# Patient Record
Sex: Female | Born: 1955 | Race: White | Hispanic: No | Marital: Single | Smoking: Former smoker
Health system: Southern US, Community
[De-identification: ages and names within clinical notes are randomized; demographics above are authoritative.]

## PROBLEM LIST (undated history)

## (undated) DIAGNOSIS — A64 Unspecified sexually transmitted disease: Secondary | ICD-10-CM

## (undated) DIAGNOSIS — D649 Anemia, unspecified: Secondary | ICD-10-CM

## (undated) DIAGNOSIS — B009 Herpesviral infection, unspecified: Secondary | ICD-10-CM

## (undated) DIAGNOSIS — D72819 Decreased white blood cell count, unspecified: Secondary | ICD-10-CM

## (undated) DIAGNOSIS — E079 Disorder of thyroid, unspecified: Secondary | ICD-10-CM

## (undated) HISTORY — PX: TOTAL HIP ARTHROPLASTY: SHX124

## (undated) HISTORY — DX: Herpesviral infection, unspecified: B00.9

## (undated) HISTORY — PX: BREAST IMPLANT REMOVAL: SUR1101

## (undated) HISTORY — DX: Anemia, unspecified: D64.9

## (undated) HISTORY — PX: COLONOSCOPY: SHX174

## (undated) HISTORY — DX: Decreased white blood cell count, unspecified: D72.819

## (undated) HISTORY — DX: Disorder of thyroid, unspecified: E07.9

## (undated) HISTORY — DX: Unspecified sexually transmitted disease: A64

## (undated) HISTORY — PX: ANUS SURGERY: SHX302

---

## 2004-11-05 ENCOUNTER — Ambulatory Visit: Payer: Self-pay | Admitting: Psychiatry

## 2005-11-07 ENCOUNTER — Ambulatory Visit: Payer: Self-pay | Admitting: Obstetrics and Gynecology

## 2008-08-12 ENCOUNTER — Ambulatory Visit: Payer: Self-pay | Admitting: Obstetrics and Gynecology

## 2008-11-13 ENCOUNTER — Ambulatory Visit: Payer: Self-pay | Admitting: Unknown Physician Specialty

## 2010-11-04 ENCOUNTER — Ambulatory Visit: Payer: Self-pay | Admitting: Obstetrics and Gynecology

## 2011-09-16 ENCOUNTER — Encounter: Payer: Self-pay | Admitting: Orthopedic Surgery

## 2011-10-01 ENCOUNTER — Encounter: Payer: Self-pay | Admitting: Orthopedic Surgery

## 2011-11-01 ENCOUNTER — Encounter: Payer: Self-pay | Admitting: Orthopedic Surgery

## 2012-01-25 ENCOUNTER — Ambulatory Visit: Payer: Self-pay | Admitting: Obstetrics and Gynecology

## 2013-05-23 ENCOUNTER — Ambulatory Visit: Payer: Self-pay | Admitting: Obstetrics and Gynecology

## 2013-05-30 ENCOUNTER — Ambulatory Visit: Payer: Self-pay | Admitting: Hematology and Oncology

## 2013-05-31 ENCOUNTER — Ambulatory Visit: Payer: Self-pay | Admitting: Hematology and Oncology

## 2013-06-06 ENCOUNTER — Ambulatory Visit: Payer: Self-pay | Admitting: Hematology and Oncology

## 2013-06-06 LAB — CBC CANCER CENTER
Basophil %: 0.9 %
Eosinophil #: 0.1 x10 3/mm (ref 0.0–0.7)
Eosinophil %: 2.3 %
HCT: 40 % (ref 35.0–47.0)
HGB: 13.9 g/dL (ref 12.0–16.0)
Lymphocyte %: 28.9 %
MCH: 31.4 pg (ref 26.0–34.0)
MCHC: 34.7 g/dL (ref 32.0–36.0)
MCV: 90 fL (ref 80–100)
Monocyte #: 0.4 x10 3/mm (ref 0.2–0.9)
Neutrophil #: 2.3 x10 3/mm (ref 1.4–6.5)
Neutrophil %: 58.5 %
RBC: 4.43 10*6/uL (ref 3.80–5.20)

## 2013-06-06 LAB — COMPREHENSIVE METABOLIC PANEL
Albumin: 3.8 g/dL (ref 3.4–5.0)
Alkaline Phosphatase: 79 U/L (ref 50–136)
Anion Gap: 8 (ref 7–16)
BUN: 22 mg/dL — ABNORMAL HIGH (ref 7–18)
Bilirubin,Total: 0.3 mg/dL (ref 0.2–1.0)
Chloride: 105 mmol/L (ref 98–107)
Co2: 28 mmol/L (ref 21–32)
Creatinine: 0.72 mg/dL (ref 0.60–1.30)
EGFR (African American): >60
EGFR (Non-African Amer.): >60
Glucose: 97 mg/dL (ref 65–99)
Osmolality: 285 (ref 275–301)
Potassium: 4.3 mmol/L (ref 3.5–5.1)
SGOT(AST): 22 U/L (ref 15–37)
Sodium: 141 mmol/L (ref 136–145)

## 2013-06-06 LAB — RAPID HIV-1/2 QL/CONFIRM: HIV-1/2,Rapid Ql: NEGATIVE

## 2013-07-01 ENCOUNTER — Ambulatory Visit: Payer: Self-pay | Admitting: Hematology and Oncology

## 2013-08-22 ENCOUNTER — Encounter: Payer: Self-pay | Admitting: Podiatrist

## 2013-08-22 ENCOUNTER — Ambulatory Visit (INDEPENDENT_AMBULATORY_CARE_PROVIDER_SITE_OTHER): Payer: BC Managed Care – PPO | Admitting: Podiatrist

## 2013-08-22 VITALS — BP 93/64 | HR 78 | Resp 16 | Ht 67.0 in | Wt 187.0 lb

## 2013-08-22 DIAGNOSIS — B351 Tinea unguium: Secondary | ICD-10-CM

## 2013-08-22 DIAGNOSIS — L03039 Cellulitis of unspecified toe: Secondary | ICD-10-CM

## 2013-08-22 MED ORDER — EFINACONAZOLE 10 % EX SOLN: 1.0000 [drp] | Freq: Every day | CUTANEOUS | Status: DC

## 2013-08-22 NOTE — Progress Notes (Signed)
Subjective:  Patient presents complainng of an infected Left great toenail.  Relates it is red ,swollen , has some pus.  Its been occurring for 8 weeks and has gradually gotten worse.  She also wants her 2nd and 5th toenails checked on the left foot.  Objective:  GENERAL APPEARANCE: Alert, conversant. Appropriately groomed. No acute distress.  VASCULAR: Pedal pulses palpable and strong bilateral.  Capillary refill time is immediate to all digits,  Proximal to distal cooling it warm to warm.  Digital hair growth is present bilateral  NEUROLOGIC: sensation is intact epicritically and protectively to 5.07 monofilament at 5/5 sites bilateral.  Light touch is intact bilateral, vibratory sensation intact bilateral, achilles tendon reflex is intact bilateral.  MUSCULOSKELETAL: acceptable muscle strength, tone and stability bilateral.  Intrinsic muscluature intact bilateral.  Rectus appearance of foot and digits noted bilateral.   DERMATOLOGIC: skin color, texture, and turger are within normal limits.  No preulcerative lesions are seen, no interdigital maceration noted.  No open lesions present.  Digital nails reveal yellow-brown discoloration with mycotic appearance 2nd Left.  5th appears dystrophic.  Left hallux has redness and swelling at the base of the nail fold- consistent with paronychia.  Assessment:  Paronychia left hallux nail.  Plan: Treatment options and alternatives discussed.  Recommended an incision and drainage and patient agreed.  Left hallux was prepped with alcohol and a 1 to 1 mix of 0.5% marcaine plain and 2% lidocaine plain was administered in a digital block fashion.  The toe was then prepped with betadine solution and exsanguinated.  The offending nail  was then excised and all necrotic tissue was resected.  The area was then cleansed well and antibiotic ointment and a dry sterile dressing was applied.  The patient was dispensed instructions for aftercare.   Recommended Jublia for the  mycotic 2nd toenail.

## 2013-08-22 NOTE — Patient Instructions (Signed)

## 2013-09-12 ENCOUNTER — Ambulatory Visit: Payer: Self-pay | Admitting: Hematology and Oncology

## 2013-09-12 LAB — CBC CANCER CENTER
Basophil %: 0.9 %
HCT: 41.2 % (ref 35.0–47.0)
HGB: 13.7 g/dL (ref 12.0–16.0)
Lymphocyte #: 1.1 x10 3/mm (ref 1.0–3.6)
Lymphocyte %: 33.8 %
MCH: 30.3 pg (ref 26.0–34.0)
MCV: 91 fL (ref 80–100)
Monocyte %: 8.9 %
Neutrophil %: 54.2 %
Platelet: 209 x10 3/mm (ref 150–440)
RDW: 12.8 % (ref 11.5–14.5)
WBC: 3.1 x10 3/mm — ABNORMAL LOW (ref 3.6–11.0)

## 2013-09-12 LAB — IRON AND TIBC
Iron Bind.Cap.(Total): 305 ug/dL (ref 250–450)
Iron Saturation: 31 %
Iron: 96 ug/dL (ref 50–170)
Unbound Iron-Bind.Cap.: 209 ug/dL

## 2013-09-30 ENCOUNTER — Ambulatory Visit: Payer: Self-pay | Admitting: Hematology and Oncology

## 2013-12-31 ENCOUNTER — Ambulatory Visit: Payer: Self-pay | Admitting: Hematology and Oncology

## 2014-01-29 ENCOUNTER — Ambulatory Visit: Payer: Self-pay | Admitting: Hematology and Oncology

## 2014-03-12 ENCOUNTER — Ambulatory Visit: Payer: Self-pay | Admitting: Hematology and Oncology

## 2014-03-31 ENCOUNTER — Ambulatory Visit: Payer: Self-pay | Admitting: Hematology and Oncology

## 2014-08-06 ENCOUNTER — Ambulatory Visit: Payer: Self-pay | Admitting: Obstetrics and Gynecology

## 2014-09-17 ENCOUNTER — Ambulatory Visit: Payer: Self-pay | Admitting: Hematology and Oncology

## 2014-09-30 ENCOUNTER — Ambulatory Visit: Payer: Self-pay | Admitting: Hematology and Oncology

## 2014-10-03 ENCOUNTER — Ambulatory Visit: Payer: Self-pay | Admitting: Surgery

## 2015-02-16 LAB — CREATINE: Creatine, Serum: 0.72

## 2015-03-16 ENCOUNTER — Inpatient Hospital Stay: Payer: BLUE CROSS/BLUE SHIELD | Attending: Hematology and Oncology | Admitting: Hematology and Oncology

## 2015-03-16 ENCOUNTER — Encounter (INDEPENDENT_AMBULATORY_CARE_PROVIDER_SITE_OTHER): Payer: Self-pay

## 2015-03-16 ENCOUNTER — Encounter: Payer: Self-pay | Admitting: Hematology and Oncology

## 2015-03-16 VITALS — BP 107/74 | HR 65 | Temp 96.0°F | Ht 67.5 in | Wt 195.8 lb

## 2015-03-16 DIAGNOSIS — D509 Iron deficiency anemia, unspecified: Secondary | ICD-10-CM | POA: Diagnosis not present

## 2015-03-16 DIAGNOSIS — D72819 Decreased white blood cell count, unspecified: Secondary | ICD-10-CM | POA: Diagnosis not present

## 2015-03-16 DIAGNOSIS — E079 Disorder of thyroid, unspecified: Secondary | ICD-10-CM | POA: Diagnosis not present

## 2015-03-16 DIAGNOSIS — Z79899 Other long term (current) drug therapy: Secondary | ICD-10-CM | POA: Diagnosis not present

## 2015-03-16 DIAGNOSIS — E538 Deficiency of other specified B group vitamins: Secondary | ICD-10-CM | POA: Insufficient documentation

## 2015-03-16 DIAGNOSIS — N63 Unspecified lump in breast: Secondary | ICD-10-CM | POA: Diagnosis not present

## 2015-03-16 NOTE — Progress Notes (Signed)
Pt here today for follow up regarding leukopenia; offers no complaints today

## 2015-04-22 ENCOUNTER — Telehealth: Payer: Self-pay | Admitting: *Deleted

## 2015-04-22 NOTE — Telephone Encounter (Signed)
  Did those get faxed to the cancer center?  Gem, can you check on this?  Thanks,  M

## 2015-04-22 NOTE — Telephone Encounter (Signed)
They are not in Dr [employer] mailbox here in [place]

## 2015-04-23 NOTE — Telephone Encounter (Signed)
I called and left a message with Mariea Stable at [phone] at the [facility] [place]. I requested pt's lab results and left the fax number for Pod 2 and our CC main number, should she have any questions.Marland KitchenMarland Kitchen

## 2015-04-24 NOTE — Telephone Encounter (Signed)
Pt notified of result being nml

## 2015-05-20 ENCOUNTER — Other Ambulatory Visit: Payer: Self-pay | Admitting: Obstetrics and Gynecology

## 2015-05-20 DIAGNOSIS — N63 Unspecified lump in unspecified breast: Secondary | ICD-10-CM

## 2015-08-14 ENCOUNTER — Ambulatory Visit: Payer: BLUE CROSS/BLUE SHIELD

## 2015-08-14 ENCOUNTER — Other Ambulatory Visit: Payer: BLUE CROSS/BLUE SHIELD

## 2015-09-09 ENCOUNTER — Other Ambulatory Visit: Payer: BLUE CROSS/BLUE SHIELD

## 2015-09-09 ENCOUNTER — Ambulatory Visit: Payer: BLUE CROSS/BLUE SHIELD

## 2015-09-17 ENCOUNTER — Ambulatory Visit: Payer: BLUE CROSS/BLUE SHIELD | Admitting: Hematology and Oncology

## 2015-09-28 ENCOUNTER — Inpatient Hospital Stay: Payer: BLUE CROSS/BLUE SHIELD | Attending: Hematology and Oncology | Admitting: Hematology and Oncology

## 2015-09-28 VITALS — BP 106/72 | HR 62 | Temp 96.3°F | Resp 18 | Ht 67.5 in | Wt 195.8 lb

## 2015-09-28 DIAGNOSIS — N63 Unspecified lump in breast: Secondary | ICD-10-CM | POA: Diagnosis not present

## 2015-09-28 DIAGNOSIS — E538 Deficiency of other specified B group vitamins: Secondary | ICD-10-CM | POA: Diagnosis not present

## 2015-09-28 DIAGNOSIS — D509 Iron deficiency anemia, unspecified: Secondary | ICD-10-CM | POA: Diagnosis not present

## 2015-09-28 DIAGNOSIS — Z79899 Other long term (current) drug therapy: Secondary | ICD-10-CM | POA: Diagnosis not present

## 2015-09-28 DIAGNOSIS — D72819 Decreased white blood cell count, unspecified: Secondary | ICD-10-CM | POA: Insufficient documentation

## 2015-09-28 DIAGNOSIS — E079 Disorder of thyroid, unspecified: Secondary | ICD-10-CM | POA: Diagnosis not present

## 2015-09-28 DIAGNOSIS — Z96642 Presence of left artificial hip joint: Secondary | ICD-10-CM | POA: Insufficient documentation

## 2015-09-28 NOTE — Progress Notes (Signed)
[facility]-  Cancer Center  Clinic day:  03/16/2015  Chief Complaint: Carol Davis is a 59 y.o. female with leukopenia who is seen for reassessment.  HPI: The patient states that she went to give blood.  She was declined secondary to anemia.  She described being really tired for 1 year.  She was diagnosed with severe iron deficiency anemia and B12 deficiency.  She denies any melena or hematochezia.  She states that her diet is healthy.  She eats meat, fruits and vegetables.  She denies any pica.  She was initially referred for leukopenia noted on a routine physical in 2014.  WBC was 3,300 on 05/29/2013.  Hemoglobin and platelet were normal.  ANC has ranged between 1900 and 2300 over the past 3 years.  She has been on B12 since 09/16/2013.  She describes being on shots for a year then oral B12 for 6 months.  She denies any problems with infections or gingivitis.    Notes indicate a history of ulcerative colitis.  She underwent colonoscopy in 2015 by Dr. Mechele Collin.  There was no signs of ulcerative colitis.  She is due for a second colonoscopy in 7 years.  She denies any prior EGD.  She denies any blood in her stool.  She was last seen in the medical oncology clinic on 09/17/2014.  At that time, she was noted to have a 3-4 cm mobile right breast mass at the 7 o'clock position.  She was referred to a breast surgeon.  She notes a breast lump was found, a "fatty tumor".  Low WBC and RBC.  Labs on 02/17/2015 from [facility] reveal a hematocrit of 39.8, hemoglobin 13.4, MCV 91, WBC 3500 with an ANC of 1700.  Differential included 49% neutrophils, 37% lymphs, 9% monocytes, 4% eosinophils, and 1% basophils.  Comprehensive metabolic panel was normal including a creatinine of 0.61.  TSH, T4, T3 uptake, and free thyroxine index were normal.    Symptomatically, she denies any B symptoms.  She states that she needs a left hip replacement.  Her knees bother her a lot secondary to her weight.   Exercising helps.  Past Medical History  Diagnosis Date  . Thyroid disease   . Leucopenia   . Anemia     IDA    No past surgical history on file.  Family History  Problem Relation Age of Onset  . Diabetes Mother   . Diabetes Father   . Diabetes Brother   She has a brother with ulcerative colitis.  Her mother has irritable bowel.  Social History:  reports that she has never smoked. She has never used smokeless tobacco. She reports that she does not drink alcohol or use illicit drugs.  The patient is alone today.  Allergies: No Known Allergies  Current Medications: Current Outpatient Prescriptions  Medication Sig Dispense Refill  . levothyroxine (SYNTHROID, LEVOTHROID) 150 MCG tablet Take 150 mcg by mouth daily before breakfast.    . valACYclovir (VALTREX) 500 MG tablet TAKE ONE TABLET TWICE A DAY WITH FOOD    . vitamin B-12 (CYANOCOBALAMIN) 1000 MCG tablet Take 1,000 mcg by mouth daily.    . cyanocobalamin (,VITAMIN B-12,) 1000 MCG/ML injection Inject 1,000 mcg into the muscle every 30 (thirty) days.    . Efinaconazole 10 % SOLN Apply 1 drop topically daily. (Patient not taking: Reported on 03/16/2015) 1 Bottle 4   No current facility-administered medications for this visit.    Review of Systems:  GENERAL:  Tired.  No fevers, sweats or weight loss. PERFORMANCE STATUS (ECOG):  1 HEENT:  No visual changes, runny nose, sore throat, mouth sores or tenderness. Lungs: No shortness of breath or cough.  No hemoptysis. Cardiac:  No chest pain, palpitations, orthopnea, or PND. GI:  Bowels are "fine".  No nausea, vomiting, diarrhea, constipation, melena or hematochezia. GU:  No urgency, frequency, dysuria, or hematuria. Musculoskeletal:  Needs left hip replacement.  Knees hurt.  No back pain.  No muscle tenderness. Extremities:  No pain or swelling. Skin:  No rashes or skin changes. Neuro:  No headache, numbness or weakness, balance or coordination issues. Endocrine:  Thyroid  disease.  No diabetes, hot flashes or night sweats. Psych:  No mood changes, depression or anxiety. Pain:  No focal pain. Review of systems:  All other systems reviewed and found to be negative.  Physical Exam: Blood pressure 107/74, pulse 65, temperature 96 F (35.6 C), height 5' 7.5" (1.715 m), weight 195 lb 12.3 oz (88.8 kg). GENERAL:  Well developed, well nourished, sitting comfortably in the exam room in no acute distress. MENTAL STATUS:  Alert and oriented to person, place and time. HEAD:  Short blonde hair.  Normocephalic, atraumatic, face symmetric, no Cushingoid features. EYES:  Blues.  Pupils equal round and reactive to light and accomodation.  No conjunctivitis or scleral icterus. ENT:  Oropharynx clear without lesion.  Tongue normal. Mucous membranes moist.  RESPIRATORY:  Clear to auscultation without rales, wheezes or rhonchi. CARDIOVASCULAR:  Regular rate and rhythm without murmur, rub or gallop. ABDOMEN:  Soft, non-tender, with active bowel sounds, and no hepatosplenomegaly.  No masses. SKIN:  No rashes, ulcers or lesions. EXTREMITIES: No edema, no skin discoloration or tenderness.  No palpable cords. LYMPH NODES: No palpable cervical, supraclavicular, axillary or inguinal adenopathy  NEUROLOGICAL: Unremarkable. PSYCH:  Appropriate.  No visits with results within 3 Day(s) from this visit. Latest known visit with results is:  Abstract on 02/16/2015  Component Date Value Ref Range Status  . Creatine, Serum 06/06/2013 0.72   Final    Assessment:  Carol Davis is a 59 y.o. female with a history of leukopenia and iron deficiency anemia.  Leuukopenia was noted on a routine physical in 2014.  WBC was 3,300 on 05/29/2013.  Hemoglobin and platelet were normal.  ANC has ranged between 1900 and 2300 over the past 3 years.  She denies any problems with infections or gingivitis.  She has B12 deficiency and has been on B12 since 09/16/2013.  She was on shots for a year then oral  B12 for 6 months.   Notes indicate a history of ulcerative colitis.  She underwent colonoscopy in 2015.  There was no signs of ulcerative colitis.  She denies any melena or hematochezia.  Diet is good.  She denies any pica.  She has a history of a 3-4 cm mobile right breast mass at the 7 o'clock position.  She was referred to a breast surgeon.  She notes a breast lump was found, a "fatty tumor".   Labs on 02/17/2015 from [facility] revealed a hematocrit of 39.8, hemoglobin 13.4, MCV 91, WBC 3500 with an ANC of 1700.  Differential was unremakable.    Symptomatically, she denies any B symptoms.  She states that she needs a left hip replacement.  Her knees bother her a lot secondary to her weight.  Exercising helps.  Exam is unremarkable.  Plan: 1. Review entire medical history, diagnosis of iron deficiency anemia and leukopenia. 2. Continue oral B12.  3. Slip for labs (ferritin, ANA).  Nurse to call patient with results 4. RTC in 6 months for MD assess and review of [facility] labs (CBC with diff, feriritin).   Rosey BathMelissa C Corcoran, MD  03/16/2015

## 2015-10-09 ENCOUNTER — Telehealth: Payer: Self-pay | Admitting: *Deleted

## 2015-10-09 NOTE — Telephone Encounter (Signed)
  Do you have her lab results?  M

## 2015-10-09 NOTE — Telephone Encounter (Signed)
Calling to make sure that Dr Merlene Pullingorcoran received a copy of her lab results. Please call her if did not receive them

## 2015-10-09 NOTE — Telephone Encounter (Signed)
Called [facility] and got a copy of patient's labs and gave them to Dr. Merlene Pullingorcoran.

## 2015-12-08 ENCOUNTER — Encounter: Payer: Self-pay | Admitting: Hematology and Oncology

## 2015-12-08 NOTE — Progress Notes (Addendum)
[facility]-  Cancer Center  Clinic day:  09/28/2015  Chief Complaint: Carol Davis is a 60 y.o. female with a history of leukopenia, iron deficiency anemia, and B12 deficiency who is seen for 6 month assessment.  HPI: The patient was last seen in the hematology clinic on 03/16/2015.  At that time, she was seen for initial assessment by me. Symptomatically she denied any B symptoms.  She needed a left hip replacement. Her knees were bothering her secondary to her weight. Exercise was helping. Labs from 02/17/2015 included a hematocrit of 39.8, hemoglobin 13.4, MCV 91, WBC 3500 with an ANC of 1700.  Differential was unremakable.  She has not neutropenic.  She denied any issues with infections.  She was to continue her oral B12.  Symptomatically, she denies any new complaints.  She feels fine.  She denies any issues with infections except for a vaginal infection.  She was given a prescription.  She continues her oral B12 daily.  Regarding her history of a breast lump, she notes her mammograms 6 months ago was fine. Biopsy by Dr. Renda Rolls was benign. She apparently had a breast implant rupture. She is planning on seeing someone about a replacement.  She is considering left hip replacement in 11/2015.    Past Medical History  Diagnosis Date  . Thyroid disease   . Leucopenia   . Anemia     IDA  She was diagnosed with ulcerative colitis at age 89 at [facility].  No past surgical history on file.  Family History  Problem Relation Age of Onset  . Diabetes Mother   . Diabetes Father   . Diabetes Brother   She has a brother with ulcerative colitis.  Her mother has irritable bowel.  Social History:  reports that she has never smoked. She has never used smokeless tobacco. She reports that she does not drink alcohol or use illicit drugs.  The patient is alone today.  Allergies: No Known Allergies  Current Medications: Current Outpatient Prescriptions  Medication Sig  Dispense Refill  . Cholecalciferol (VITAMIN D-1000 MAX ST) 1000 UNITS tablet Take by mouth.    . cyanocobalamin (,VITAMIN B-12,) 1000 MCG/ML injection Inject 1,000 mcg into the muscle every 30 (thirty) days.    . Efinaconazole 10 % SOLN Apply 1 drop topically daily. 1 Bottle 4  . Glucosamine-Chondroitin 750-600 MG TABS Take by mouth.    . levothyroxine (SYNTHROID, LEVOTHROID) 150 MCG tablet Take 150 mcg by mouth daily before breakfast.    . LEVOXYL 100 MCG tablet   2  . valACYclovir (VALTREX) 500 MG tablet TAKE ONE TABLET TWICE A DAY WITH FOOD    . vitamin B-12 (CYANOCOBALAMIN) 1000 MCG tablet Take 1,000 mcg by mouth daily.     No current facility-administered medications for this visit.    Review of Systems:  GENERAL:  Feels "fine".  No fevers, sweats or weight loss. PERFORMANCE STATUS (ECOG):  1 HEENT:  No visual changes, runny nose, sore throat, mouth sores or tenderness. Lungs: No shortness of breath or cough.  No hemoptysis. Cardiac:  No chest pain, palpitations, orthopnea, or PND. Breasts:  Breast implant rupture.  Mammogram 6 months ago was "fine".  Biopsy was benign. GI:  Bowels are "fine".  No nausea, vomiting, diarrhea, constipation, melena or hematochezia. GU:  No urgency, frequency, dysuria, or hematuria. Musculoskeletal:  Needs left hip replacement.  Knees hurt.  No back pain.  No muscle tenderness. Extremities:  No pain or swelling. Skin:  No rashes or skin changes. Neuro:  No headache, numbness or weakness, balance or coordination issues. Endocrine:  Thyroid disease.  No diabetes, hot flashes or night sweats. Psych:  No mood changes, depression or anxiety. Pain:  No focal pain. Review of systems:  All other systems reviewed and found to be negative.  Physical Exam: Blood pressure 106/72, pulse 62, temperature 96.3 F (35.7 C), temperature source Tympanic, resp. rate 18, height 5' 7.5" (1.715 m), weight 195 lb 12.3 oz (88.8 kg). GENERAL:  Well developed, well  nourished, sitting comfortably in the exam room in no acute distress. MENTAL STATUS:  Alert and oriented to person, place and time. HEAD:  Short blonde hair.  Normocephalic, atraumatic, face symmetric, no Cushingoid features. EYES:  Hazel/green.  Pupils equal round and reactive to light and accomodation.  No conjunctivitis or scleral icterus. ENT:  Oropharynx clear without lesion.  Tongue normal. Mucous membranes moist.  RESPIRATORY:  Clear to auscultation without rales, wheezes or rhonchi. CARDIOVASCULAR:  Regular rate and rhythm without murmur, rub or gallop. ABDOMEN:  Soft, non-tender, with active bowel sounds, and no hepatosplenomegaly.  No masses. SKIN:  No rashes, ulcers or lesions. EXTREMITIES: No edema, no skin discoloration or tenderness.  No palpable cords. LYMPH NODES: No palpable cervical, supraclavicular, axillary or inguinal adenopathy  NEUROLOGICAL: Unremarkable. PSYCH:  Appropriate.  [facility] testing: Labs on 02/17/2015 from [facility] revealed a hematocrit of 39.8, hemoglobin 13.4, MCV 91, WBC 3500 with an ANC of 1700.  Differential was unremakable.   No visits with results within 3 Day(s) from this visit. Latest known visit with results is:  Abstract on 02/16/2015  Component Date Value Ref Range Status  . Creatine, Serum 06/06/2013 0.72   Final    Assessment:  Carol Davis is a 59 y.o. female with a history of leukopenia and iron deficiency anemia.  Leuukopenia was noted on a routine physical in 2014.  WBC was 3,300 on 05/29/2013.  Hemoglobin and platelet were normal.  ANC has ranged between 1900 and 2300 over the past 3 years.  She denies any problems with infections or gingivitis.  She has B12 deficiency and has been on B12 since 09/16/2013.  She was on shots for a year then oral B12 for 6 months.   She was diagnosed with ulcerative colitis at age 38 at [facility].  She underwent colonoscopy in 2015.  There was no signs of ulcerative colitis.  She denies any melena or  hematochezia.  Diet is good.  She denies any pica.  She has a history of a 3-4 cm mobile right breast mass at the 7 o'clock position.  Breast biopsy was benign per patient report.  Mammogram 6 months ago was "fine".  She was noted to have a breast implant rupture.   Labs on 02/17/2015 from [facility] revealed a hematocrit of 39.8, hemoglobin 13.4, MCV 91, WBC 3500 with an ANC of 1700.  Differential was unremakable.    Symptomatically, she denies any B symptoms.  She is considering a left hip replacement in 2017.  Exam is unremarkable.  Plan: 1. [facility] slip:  CBC with diff, ANA, B12, folate, ferritin, iron studies. 2. Patient to call RN for lab results. 3. Continue oral B12. 4. RTC prn.  Addendum:  Labs on 02/17/2015 from [facility] revealed a hematocrit of 38.7, hemoglobin 12.9, MCV 91, WBC 4200 with an ANC of 2700.  Differential was unremakable.  Ferritin was 95. Iron studies included a saturation of 34% with a TIBC of 228 (low).  B12  was 927. Folate was 8.9.  ANA was negative.   Rosey Bath, MD  09/28/2015

## 2016-01-04 ENCOUNTER — Ambulatory Visit: Payer: BLUE CROSS/BLUE SHIELD | Attending: Orthopedic Surgery | Admitting: Physical Therapy

## 2016-01-04 DIAGNOSIS — Z96649 Presence of unspecified artificial hip joint: Secondary | ICD-10-CM

## 2016-01-04 DIAGNOSIS — M25562 Pain in left knee: Secondary | ICD-10-CM | POA: Diagnosis present

## 2016-01-04 DIAGNOSIS — Z9660 Presence of unspecified orthopedic joint implant: Secondary | ICD-10-CM | POA: Diagnosis present

## 2016-01-04 DIAGNOSIS — R531 Weakness: Secondary | ICD-10-CM | POA: Diagnosis present

## 2016-01-04 DIAGNOSIS — M25561 Pain in right knee: Secondary | ICD-10-CM | POA: Diagnosis present

## 2016-01-04 NOTE — Patient Instructions (Signed)
All exercises provided were adapted from [email or web address]. Patient was provided a written handout with pictures as described. Any additional cues were manually entered in to handout and copied in to this document.  Prone Quadriceps Stretch  Lie on your stomach; hook a strap around your foot/ankle.  Gently pull your heel towards your bottom until a stretch is felt in the front of the thigh.  Keep your back relaxed and your hips flat on the table.  Supine bridge  Lie down on your back and bend your knees so that your feet are flat on the table. Press your heels into the ground to lift your hips off of the table. You should stabilize through your core. Return to the starting position controlling your speed.    PATELLA MEDIAL GLIDE - SELF MOBILIZATION  Place your hand along the outer edge of your knee cap and slide it inward toward your midline.

## 2016-01-05 NOTE — Therapy (Signed)
[facility] [address], [place], [address] Phone: [phone]   Fax:  [phone]  Physical Therapy Evaluation  Patient Details  Name: Carol Davis MRN: [id number] Date of Birth: January 26, 1956 No Data Recorded  Encounter Date: 01/04/2016      PT End of Session - 01/04/16 0916    Visit Number 1   Number of Visits 9   Date for PT Re-Evaluation 02/08/16   PT Start Time 0805   PT Stop Time 0905   PT Time Calculation (min) 60 min   Activity Tolerance Patient tolerated treatment well   Behavior During Therapy [facility] for tasks assessed/performed      Past Medical History  Diagnosis Date  . Thyroid disease   . Leucopenia   . Anemia     IDA    No past surgical history on file.  There were no vitals filed for this visit.  Visit Diagnosis:  Right anterior knee pain - Plan: PT plan of care cert/re-cert  Left anterior knee pain - Plan: PT plan of care cert/re-cert  S/P hip replacement - Plan: PT plan of care cert/re-cert  Generalized weakness - Plan: PT plan of care cert/re-cert      Subjective Assessment - 01/04/16 0919    Subjective Patient reports she used a rowing ergometer and performed knee curls/extensions at the gym roughly 2.5 weeks ago and began to have roughly 2 weeks of knee pain limiting her ability to move around at work and at home.   Pertinent History Patient reports she had L anterior approach THA on 11/04/2015. She has tried going to a [employer] 1x per week since the start of February. She reports knee pain (which is her chief concern) bilaterally after prolonged sitting and with ascending/descending stairs. She has had this pain for years, but has intensified since her operation.    Limitations Sitting;Standing   How long can you stand comfortably? 20-30 minutes   Patient Stated Goals To complete weight lifting classes and return to the gym.    Currently in Pain? Yes   Pain Score --  Worst pain is 5/10, she reports her R knee hurts more today but does not rate specifically.    Pain Location Knee   Pain Orientation Left;Right   Pain Descriptors / Indicators Aching   Pain Type Chronic pain   Pain Onset More than a month ago   Pain Frequency Intermittent   Aggravating Factors  Prolonged sitting then standing,             [facility] PT Assessment - 01/04/16 0810    Assessment   Medical Diagnosis --  L total hip replacement    Referring Provider --  Dr. Myer Haff    Precautions   Precautions Anterior Hip   Precaution Comments --  Sx on 11/04/2015   Restrictions   Weight Bearing Restrictions Yes   LLE Weight Bearing Weight bearing as tolerated   Balance Screen   Has the patient fallen in the past 6 months No   Prior Function   Level of Independence Independent   Vocation Full time employment   Vocation Requirements --  Librarian   Cognition   Overall Cognitive Status Within Functional Limits for tasks assessed   Observation/Other Assessments   Lower Extremity Functional Scale  35   Sensation   Light Touch Appears Intact   Step Up   Comments --  Anterior knee displacement -- painful  Step Down   Comments --  Painful on descent, requires use of UEs   Sit to Stand   Comments --  Valgus bilaterally, requires use of UEs, feet posterior knee   Strength   Right Hip Flexion 5/5   Right Hip External Rotation  4+/5   Right Hip Internal Rotation 4+/5   Right Hip ABduction 4/5   Left Hip Flexion 5/5   Left Hip External Rotation 4+/5   Left Hip Internal Rotation 4-/5   Left Hip ABduction 3+/5   Right Knee Flexion 5/5   Right Knee Extension 5/5   Left Knee Flexion 5/5   Left Knee Extension 5/5   Right Ankle Dorsiflexion 5/5   Right Ankle Plantar Flexion 5/5   Right Ankle Inversion 5/5   Right Ankle Eversion 5/5   Left Ankle Dorsiflexion 5/5   Left Ankle Plantar Flexion 5/5   Left Ankle Inversion 5/5   Left Ankle Eversion 5/5    Palpation   Patella mobility --  Pain with medial glide on RLE   Spinal mobility --  No pain with grade II throughout L spine   Criag's Test    Findings Negative   Ely's Test   Findings Positive   Side Right;Left   Comments --  R side to 90 degrees, L side further    Step-up/Step Down    Findings Positive   Side  Right;Left     TherEx Prone quadriceps stretching x 10 on RLE with blue belt x 10, x 10 on LLE. 2 rounds of PNF contract-relax on RLE x 5 repetitions (well tolerated, noted decreased pain with mobility afterwards).  Medial glide on RLE x 30" for 3 bouts with knee in extension (noted pain initially which reduced with repeated exposure).  Supine bridging x 10 repetitions with cuing for gluteal activation (no pain with this, appropriate technique and foot placement).                      PT Education - 01/04/16 [id number]    Education provided Yes   Education Details Educated patient on compilation of symptoms relating to knee pain bilaterally and HEP to address findings.    Person(s) Educated Patient   Methods Explanation;Demonstration;Handout   Comprehension Verbalized understanding;Returned demonstration             PT Long Term Goals - 01/04/16 1042    PT LONG TERM GOAL #1   Title Patient will report an LEFS score of greater than 45/80 to demonstrate improved tolerance for ADLs.   Baseline 35/80   Time 4   Period Weeks   Status New   PT LONG TERM GOAL #2   Title Patient will report worst knee pain of 2/10 during functional activities to demonstrate improved tolerance for ADLs.    Baseline 5/10   Time 6   Period Weeks   Status New   PT LONG TERM GOAL #3   Title Patient will transfer sit to stand without use of hands to demonstrate improved functional strength for ADLs.    Baseline Unable to transfer without use of UEs.    Time 4   Period Weeks   Status New   PT LONG TERM GOAL #4   Title Patient will complete weight lifting session with no  increase in pain and full participation to return to recreational activities.    Time 4   Period Weeks   Status New  Plan - 01/04/16 0916    Clinical Impression Statement Patient demonstrates generalized LE weakness, particularly with inability to complete sit to stand transfer without use of UEs. She also demonstrates retinacular and quadricep tightness on RLE which reproduce her symptoms, and inability to perform SLR on LLE secondary to weakness. It appears her knee pain is a result of underlying thigh and hip weakness and soft tissue restrictions. She would benefit from skilled PT services to address the above deficits for full recreational participation.    Pt will benefit from skilled therapeutic intervention in order to improve on the following deficits Abnormal gait;Difficulty walking;Pain;Decreased balance;Decreased strength;Decreased mobility;Decreased activity tolerance   Rehab Potential Good   Clinical Impairments Affecting Rehab Potential Age, chronicity of pain   PT Frequency 2x / week   PT Duration 4 weeks   PT Treatment/Interventions Taping;Therapeutic exercise;Therapeutic activities;Manual techniques;Balance training;Gait training;Stair training   PT Next Visit Plan Sidelying clamshells, quadricep strengthening, gait training.   PT Home Exercise Plan See patient instructions.    Consulted and Agree with Plan of Care Patient         Problem List Patient Active Problem List   Diagnosis Date Noted  . Leukopenia 03/16/2015  . Iron deficiency anemia 03/16/2015  . B12 deficiency 03/16/2015   Kerin RansomPatrick A Jazmon Kos, PT, DPT    01/05/2016, 12:35 PM  [facility] [address] [place], [place], [address] Phone: [phone]   Fax:  [phone]  Name: Louann SjogrenBetty L Colebank MRN: [id number] Date of Birth: 12/03/1955

## 2016-01-07 ENCOUNTER — Ambulatory Visit: Payer: BLUE CROSS/BLUE SHIELD | Admitting: Physical Therapy

## 2016-01-11 ENCOUNTER — Ambulatory Visit: Payer: BLUE CROSS/BLUE SHIELD | Admitting: Physical Therapy

## 2016-01-14 ENCOUNTER — Ambulatory Visit: Payer: BLUE CROSS/BLUE SHIELD | Admitting: Physical Therapy

## 2016-01-14 DIAGNOSIS — R531 Weakness: Secondary | ICD-10-CM

## 2016-01-14 DIAGNOSIS — M25561 Pain in right knee: Secondary | ICD-10-CM | POA: Diagnosis not present

## 2016-01-14 DIAGNOSIS — Z96649 Presence of unspecified artificial hip joint: Secondary | ICD-10-CM

## 2016-01-14 NOTE — Therapy (Signed)
[place] [facility] [address] [place], [place], [address] Phone: [phone]   Fax:  [phone]  Physical Therapy Treatment  Patient Details  Name: Carol SjogrenBetty L Russomanno MRN: [id number] Date of Birth: 11/07/1955 No Data Recorded  Encounter Date: 01/14/2016      PT End of Session - 01/14/16 1815    Visit Number 2   Number of Visits 9   Date for PT Re-Evaluation 02/08/16   PT Start Time 1530   PT Stop Time 1610   PT Time Calculation (min) 40 min   Activity Tolerance Patient tolerated treatment well   Behavior [facility] Therapy [facility] for tasks assessed/performed      Past Medical History  Diagnosis Date  . Thyroid disease   . Leucopenia   . Anemia     IDA    No past surgical history on file.  There were no vitals filed for this visit.  Visit Diagnosis:  Right anterior knee pain  Generalized weakness  S/P hip replacement      Subjective Assessment - 01/14/16 1538    Subjective Patient reports her walking has been better, though she reports prone quad stretching has been painful and has had trouble with medial glide of her RLE.    Pertinent History Patient reports she had L anterior approach THA on 11/04/2015. She has tried going [facility] a [employer] 1x per week since the start of February. She reports knee pain (which is her chief concern) bilaterally after prolonged sitting and with ascending/descending stairs. She has had this pain for years, but has intensified since her operation.    Limitations Sitting;Standing   How long can you stand comfortably? 20-30 minutes   Patient Stated Goals To complete weight lifting classes and return to the gym.    Currently in Pain? Yes  6-7 during ambulaiton, none at rest in her R knee      TherEx Prone quadriceps stretching with PT assistance for slight overpressure 3 bouts x 45-60" per bout Manually assisted PF stretch into DF while patient had RLE extended 3 bouts  x 30" PT assessed medial/lateral glide on RLE of patella, no pain reported in this session.  Patient reported minor decrease in R knee pain after the above stretching  Eccentric lowering/stretching of calf musculature on 1st step x 10 repetitions, after 2 bouts patient ambulated in clinic and reports notable change  Lunge stretching for knee extensors onto 2nd step 2 bouts x 10 repetitions, patient reports significantly less pain in her R knee both on flat ground and ascending/descending steps.   After reports of decreased symptoms, repeated 2 bouts of eccentric calf stretching and lunge stretching. Patient reports at the end of session noticeable reduction in symptoms. Educated patient about static compression of patella and how this can irritate local nervous system, to gradually progress stretching program and begin strengthening program.   Standing hip abductions with HHA x 10 bilaterally with appropriate activation of hip abductors.                                 PT Long Term Goals - 01/04/16 1042    PT LONG TERM GOAL #1   Title Patient will report an LEFS score of greater than 45/80 to demonstrate improved tolerance for ADLs.   Baseline 35/80   Time 4   Period Weeks   Status New   PT LONG TERM  GOAL #2   Title Patient will report worst knee pain of 2/10 during functional activities to demonstrate improved tolerance for ADLs.    Baseline 5/10   Time 6   Period Weeks   Status New   PT LONG TERM GOAL #3   Title Patient will transfer sit to stand without use of hands to demonstrate improved functional strength for ADLs.    Baseline Unable to transfer without use of UEs.    Time 4   Period Weeks   Status New   PT LONG TERM GOAL #4   Title Patient will complete weight lifting session with no increase in pain and full participation to return to recreational activities.    Time 4   Period Weeks   Status New               Plan - 01/14/16 1816     Clinical Impression Statement Patient demonstrates reduced symptoms with knee extensor stretching and eccentric loading/stretching of PFs, indicating she likely has symptoms of knee pain from excessive static compression of the knee joint. Patient does demonstrate RLE abductor weakness both at eval and in this session which would also exacerbate knee pain and should be addressed.    Pt will benefit from skilled therapeutic intervention in order to improve on the following deficits Abnormal gait;Difficulty walking;Pain;Decreased balance;Decreased strength;Decreased mobility;Decreased activity tolerance   Rehab Potential Good   Clinical Impairments Affecting Rehab Potential Age, chronicity of pain   PT Frequency 2x / week   PT Duration 4 weeks   PT Treatment/Interventions Taping;Therapeutic exercise;Therapeutic activities;Manual techniques;Balance training;Gait training;Stair training   PT Next Visit Plan Sidelying clamshells, quadricep strengthening, gait training.   PT Home Exercise Plan See patient instructions.    Consulted and Agree with Plan of Care Patient        Problem List Patient Active Problem List   Diagnosis Date Noted  . Leukopenia 03/16/2015  . Iron deficiency anemia 03/16/2015  . B12 deficiency 03/16/2015    Kerin Ransom, PT, DPT    01/14/2016, 6:18 PM  [place] [facility] SPORTS MEDICINE [address] [place], [address] Phone: [phone]   Fax:  [phone]  Name: Carol Davis MRN: [id number] Date of Birth: 04/26/1956

## 2016-01-18 ENCOUNTER — Ambulatory Visit: Payer: BLUE CROSS/BLUE SHIELD | Admitting: Physical Therapy

## 2016-01-18 DIAGNOSIS — R531 Weakness: Secondary | ICD-10-CM

## 2016-01-18 DIAGNOSIS — M25562 Pain in left knee: Secondary | ICD-10-CM

## 2016-01-18 DIAGNOSIS — Z96649 Presence of unspecified artificial hip joint: Secondary | ICD-10-CM

## 2016-01-18 DIAGNOSIS — M25561 Pain in right knee: Secondary | ICD-10-CM | POA: Diagnosis not present

## 2016-01-18 NOTE — Therapy (Signed)
[place] [facility] [address] [place], [address] Phone: [phone]   Fax:  [phone]  Physical Therapy Treatment  Patient Details  Name: Carol Davis MRN: [id number] Date of Birth: 25-Feb-1956 No Data Recorded  Encounter Date: 01/18/2016      PT End of Session - 01/18/16 0900    Visit Number 3   Number of Visits 9   Date for PT Re-Evaluation 02/08/16   PT Start Time 0815   PT Stop Time 0857   PT Time Calculation (min) 42 min   Activity Tolerance Patient tolerated treatment well  Mild increase in R knee pain   Behavior During Therapy [facility] for tasks assessed/performed      Past Medical History  Diagnosis Date  . Thyroid disease   . Leucopenia   . Anemia     IDA    No past surgical history on file.  There were no vitals filed for this visit.  Visit Diagnosis:  Right anterior knee pain  Generalized weakness  S/P hip replacement  Left anterior knee pain      Subjective Assessment - 01/18/16 0817    Subjective Patient reports her ROM has improved significantly in her knee. She reports she was able to increase her walking and completed all exercises over the weekend and increased ambulation with no additional complaints.    Pertinent History Patient reports she had L anterior approach THA on 11/04/2015. She has tried going to a [employer] 1x per week since the start of February. She reports knee pain (which is her chief concern) bilaterally after prolonged sitting and with ascending/descending stairs. She has had this pain for years, but has intensified since her operation.    Limitations Sitting;Standing   How long can you stand comfortably? 20-30 minutes   Patient Stated Goals To complete weight lifting classes and return to the gym.    Currently in Pain? Yes   Pain Score 3    Pain Location Knee   Pain Orientation Right  L knee bothers her as well   Pain Descriptors /  Indicators Aching   Pain Type Chronic pain   Pain Onset More than a month ago   Pain Frequency Intermittent      PNF contract relax with isometric quadricep contraction 3 bouts x 10 repetitions in prone with stretch past 90 degrees (increased symptoms afterwards)  Eccentric calf stretching 2 bouts of 10 repetitions for 3" holds   Lunge knee extensor stretch to 2nd step  2 bouts initially for 10 repetitions with 3" holds, additional 2 bouts after PNF contract relax performed to reduce symptoms.  Palpation into distal IT band, fibular head area -- performed mobilizations of fibular head, no change in symptoms. Adductor stretch with trigger point ischemia applied/soft tissue mobilization x 2 bouts of 45", patient noted tenderness while pressure applied, but some relief with ambulation afterwards. Progressed to side lunges for adductor stretch, cuing for wider stance x 10 for 2 sets.   Supine bridging (cuing for going on heels, attempted alternating lifts) x 10 for 2 sets (cuing for pushing through heels as she initially reports tension in her quadricep and anterior core) attempted alternating lifts, however she fatigued quickly.                             PT Education - 01/18/16 1842    Education provided Yes   Education Details  Progression of HEP and activities.   Person(s) Educated Patient   Methods Explanation;Demonstration   Comprehension Verbalized understanding;Returned demonstration             PT Long Term Goals - 01/04/16 1042    PT LONG TERM GOAL #1   Title Patient will report an LEFS score of greater than 45/80 to demonstrate improved tolerance for ADLs.   Baseline 35/80   Time 4   Period Weeks   Status New   PT LONG TERM GOAL #2   Title Patient will report worst knee pain of 2/10 during functional activities to demonstrate improved tolerance for ADLs.    Baseline 5/10   Time 6   Period Weeks   Status New   PT LONG TERM GOAL #3   Title  Patient will transfer sit to stand without use of hands to demonstrate improved functional strength for ADLs.    Baseline Unable to transfer without use of UEs.    Time 4   Period Weeks   Status New   PT LONG TERM GOAL #4   Title Patient will complete weight lifting session with no increase in pain and full participation to return to recreational activities.    Time 4   Period Weeks   Status New               Plan - 01/18/16 0901    Clinical Impression Statement Patient reports symptoms with palpation and stretching of R adductors, and stretch on quadriceps/plantarflexors in this session. She reports decreased pain number from last session, increased ROM, however she reports her symptoms aren't much different. She demonstrates tightness/decreased ROM around her knee and improving hip strength, which are all likely contributing to her symptoms.    Pt will benefit from skilled therapeutic intervention in order to improve on the following deficits Abnormal gait;Difficulty walking;Pain;Decreased balance;Decreased strength;Decreased mobility;Decreased activity tolerance   Rehab Potential Good   Clinical Impairments Affecting Rehab Potential Age, chronicity of pain   PT Frequency 2x / week   PT Duration 4 weeks   PT Treatment/Interventions Taping;Therapeutic exercise;Therapeutic activities;Manual techniques;Balance training;Gait training;Stair training   PT Next Visit Plan Quadricep strengthening/stretching, posterior chain strengthening.   PT Home Exercise Plan See patient instructions.    Consulted and Agree with Plan of Care Patient        Problem List Patient Active Problem List   Diagnosis Date Noted  . Leukopenia 03/16/2015  . Iron deficiency anemia 03/16/2015  . B12 deficiency 03/16/2015   Kerin RansomPatrick A Micha Erck, PT, DPT    01/18/2016, 6:46 PM  [place] [facility] [address] [place], [place], [address] Phone:  [phone]   Fax:  [phone]  Name: Carol Davis MRN: [id number] Date of Birth: 05/05/1956

## 2016-01-26 ENCOUNTER — Ambulatory Visit: Payer: BLUE CROSS/BLUE SHIELD | Admitting: Physical Therapy

## 2016-01-26 DIAGNOSIS — Z96649 Presence of unspecified artificial hip joint: Secondary | ICD-10-CM

## 2016-01-26 DIAGNOSIS — M25561 Pain in right knee: Secondary | ICD-10-CM

## 2016-01-26 DIAGNOSIS — R531 Weakness: Secondary | ICD-10-CM

## 2016-01-26 NOTE — Therapy (Signed)
[facility] [facility]IC[facility] [address] [place], [place], [address] Phone: [phone]   Fax:  [phone]  Physical Therapy Treatment  Patient Details  Name: Carol Davis MRN: [id number] Date of Birth: 01/08/1956 No Data Recorded  Encounter Date: 01/26/2016      PT End of Session - 01/26/16 1205    Visit Number 4   Number of Visits 9   Date for PT Re-Evaluation 02/08/16   PT Start Time 0806   PT Stop Time 0845   PT Time Calculation (min) 39 min   Activity Tolerance Patient tolerated treatment well;No increased pain   Behavior During Therapy [facility] for tasks assessed/performed      Past Medical History  Diagnosis Date  . Thyroid disease   . Leucopenia   . Anemia     IDA    No past surgical history on file.  There were no vitals filed for this visit.  Visit Diagnosis:  Right anterior knee pain  S/P hip replacement  Generalized weakness      Subjective Assessment - 01/26/16 0811    Subjective Patient reports [place] went to a [place] in [place] over the weekend, she has had pain (fairly intense) with knee flexion ROM exercises in anterolateral knee, just below the joint line. No hip pain reported.   Pertinent History Patient reports she had L anterior approach THA on 11/04/2015. She has tried going to a [employer] 1x per week since the start of February. She reports knee pain (which is her chief concern) bilaterally after prolonged sitting and with ascending/descending stairs. She has had this pain for years, but has intensified since her operation.    Limitations Sitting;Standing   How long can you stand comfortably? 20-30 minutes   Patient Stated Goals To complete weight lifting classes and return to the gym.    Currently in Pain? Yes   Pain Score 4    Pain Location Knee   Pain Orientation Right   Pain Descriptors / Indicators Aching;Tightness   Pain Type Chronic pain   Pain Onset More than a  month ago   Pain Frequency Intermittent   Aggravating Factors  Midstance while ambulating. Prolonged sitting then standing.    Pain Relieving Factors Sitting/non-weight bearing.      Standing lunge stretch x 5 repetitions with HHA from table, felt more pain in R knee when adductors stretched on medial side, able to feel adductor stretch on LLE. Manual palpation of multiple tender points in her adductors, soft tissue mobilization provided to R adductor both in sitting and supine with manual hip abduction. Patient reports her pain in her knee decreased from a 4-5 to a 1-2 after this.   Attempted standing hip abductor stretch with foot elevated, unable to complete comfortably after multiple attempts with different foot positioning.   Single leg stance on blue foam pad kicking into hip abduction x 12 bilaterally for 2 sets with HHA for balance only (challenging on dynamic ankle stability)   Standing manually assisted quadricep stretching 3 bouts x 6 repetitions with 10" holds (some discomfort afterwards, though more tolerable than in prone)                            PT Education - 01/26/16 1204    Education provided Yes   Education Details To limit the pain she experiences with quad stretching, begin hip adductor stretching in supine as demonstrated in  this session.    Person(s) Educated Patient   Methods Explanation;Demonstration;Handout   Comprehension Verbalized understanding;Returned demonstration             PT Long Term Goals - 01/04/16 1042    PT LONG TERM GOAL #1   Title Patient will report an LEFS score of greater than 45/80 to demonstrate improved tolerance for ADLs.   Baseline 35/80   Time 4   Period Weeks   Status New   PT LONG TERM GOAL #2   Title Patient will report worst knee pain of 2/10 during functional activities to demonstrate improved tolerance for ADLs.    Baseline 5/10   Time 6   Period Weeks   Status New   PT LONG TERM GOAL #3    Title Patient will transfer sit to stand without use of hands to demonstrate improved functional strength for ADLs.    Baseline Unable to transfer without use of UEs.    Time 4   Period Weeks   Status New   PT LONG TERM GOAL #4   Title Patient will complete weight lifting session with no increase in pain and full participation to return to recreational activities.    Time 4   Period Weeks   Status New               Plan - 01/26/16 1206    Clinical Impression Statement Patient continues to report R knee pain, especially with knee flexion or any attempt at quadricep stretching. She displays pain and tenderness throughout her adductors initially, which once treated reduced as did her symptoms. Appeared to be more effective when PT manually stretched in supine. No pain associated with patellar mobility at this time.    Pt will benefit from skilled therapeutic intervention in order to improve on the following deficits Abnormal gait;Difficulty walking;Pain;Decreased balance;Decreased strength;Decreased mobility;Decreased activity tolerance   Rehab Potential Good   Clinical Impairments Affecting Rehab Potential Age, chronicity of pain   PT Frequency 2x / week   PT Duration 4 weeks   PT Treatment/Interventions Taping;Therapeutic exercise;Therapeutic activities;Manual techniques;Balance training;Gait training;Stair training   PT Next Visit Plan Quadricep strengthening/stretching, posterior chain strengthening. Adductor stretching and strengthening.    PT Home Exercise Plan See patient instructions.    Consulted and Agree with Plan of Care Patient        Problem List Patient Active Problem List   Diagnosis Date Noted  . Leukopenia 03/16/2015  . Iron deficiency anemia 03/16/2015  . B12 deficiency 03/16/2015   Kerin Ransom, PT, DPT    01/26/2016, 12:38 PM  [place] [facility] PHYSICAL AND SPORTS MEDICINE [address], [place],  [address] Phone: [phone]   Fax:  [phone]  Name: Carol Davis MRN: [id number] Date of Birth: 02/20/1956

## 2016-01-28 ENCOUNTER — Ambulatory Visit: Payer: BLUE CROSS/BLUE SHIELD | Admitting: Physical Therapy

## 2016-01-28 DIAGNOSIS — M25561 Pain in right knee: Secondary | ICD-10-CM

## 2016-01-28 DIAGNOSIS — R531 Weakness: Secondary | ICD-10-CM

## 2016-01-28 DIAGNOSIS — Z96649 Presence of unspecified artificial hip joint: Secondary | ICD-10-CM

## 2016-01-28 NOTE — Therapy (Signed)
[place] [facility] [address] [place], [place], [address] Phone: [phone]   Fax:  [phone]  Physical Therapy Treatment  Patient Details  Name: Carol Davis MRN: [id number] Date of Birth: 03/27/1956 No Data Recorded  Encounter Date: 01/28/2016      PT End of Session - 01/28/16 1423    Visit Number 5   Number of Visits 9   Date for PT Re-Evaluation 02/08/16   PT Start Time 0840   PT Stop Time 0900   PT Time Calculation (min) 20 min   Activity Tolerance Patient tolerated treatment well;No increased pain   Behavior During Therapy [facility] for tasks assessed/performed      Past Medical History  Diagnosis Date  . Thyroid disease   . Leucopenia   . Anemia     IDA    No past surgical history on file.  There were no vitals filed for this visit.  Visit Diagnosis:  Right anterior knee pain  S/P hip replacement  Generalized weakness      Subjective Assessment - 01/28/16 0848    Subjective Patient arrived late today, she wrote down the wrong appointment time. She reports she has been diligent with her HEP and after her weightlifting class the other day   Pertinent History Patient reports she had L anterior approach THA on 11/04/2015. She has tried going to a [employer] 1x per week since the start of February. She reports knee pain (which is her chief concern) bilaterally after prolonged sitting and with ascending/descending stairs. She has had this pain for years, but has intensified since her operation.    Limitations Sitting;Standing   How long can you stand comfortably? 20-30 minutes   Patient Stated Goals To complete weight lifting classes and return to the gym.    Currently in Pain? Yes   Pain Score 2    Pain Onset More than a month ago      Supine hip abductor stretching, modified to using a belt at distal tibia 2 bouts x 10 repetitions.   SLR HS stretching 2 bouts x 10 repetitions    ** After stretching completed patient reported knee pain decreased from 4 to 2/10. Cuing provided for where to use belt, which she was able to perform adequately.                            PT Education - 01/28/16 1423    Education provided Yes   Education Details Hip abductor stretching, SLR stretching.    Person(s) Educated Patient   Methods Explanation;Demonstration;Handout   Comprehension Verbalized understanding;Returned demonstration             PT Long Term Goals - 01/04/16 1042    PT LONG TERM GOAL #1   Title Patient will report an LEFS score of greater than 45/80 to demonstrate improved tolerance for ADLs.   Baseline 35/80   Time 4   Period Weeks   Status New   PT LONG TERM GOAL #2   Title Patient will report worst knee pain of 2/10 during functional activities to demonstrate improved tolerance for ADLs.    Baseline 5/10   Time 6   Period Weeks   Status New   PT LONG TERM GOAL #3   Title Patient will transfer sit to stand without use of hands to demonstrate improved functional strength for ADLs.    Baseline Unable to transfer  without use of UEs.    Time 4   Period Weeks   Status New   PT LONG TERM GOAL #4   Title Patient will complete weight lifting session with no increase in pain and full participation to return to recreational activities.    Time 4   Period Weeks   Status New               Plan - 01/28/16 1424    Clinical Impression Statement Patient reports significant relief of symptoms after her weightlifting session and her stretching program. She reports decreased symptoms in this session with abductor and hamstring stretching in supine (from 4/10 to 2/10).    Pt will benefit from skilled therapeutic intervention in order to improve on the following deficits Abnormal gait;Difficulty walking;Pain;Decreased balance;Decreased strength;Decreased mobility;Decreased activity tolerance   Rehab Potential Good   Clinical  Impairments Affecting Rehab Potential Age, chronicity of pain   PT Frequency 2x / week   PT Duration 4 weeks   PT Treatment/Interventions Taping;Therapeutic exercise;Therapeutic activities;Manual techniques;Balance training;Gait training;Stair training   PT Next Visit Plan Quadricep strengthening/stretching, posterior chain strengthening. Adductor stretching and strengthening.    PT Home Exercise Plan See patient instructions.    Consulted and Agree with Plan of Care Patient        Problem List Patient Active Problem List   Diagnosis Date Noted  . Leukopenia 03/16/2015  . Iron deficiency anemia 03/16/2015  . B12 deficiency 03/16/2015   Kerin Ransom, PT, DPT    01/28/2016, 2:26 PM  [place] [facility] PHYSICAL AND SPORTS MEDICINE [address], [place], [address] Phone: [phone]   Fax:  [phone]  Name: Carol Davis MRN: [id number] Date of Birth: 30-Apr-1956

## 2016-02-01 ENCOUNTER — Ambulatory Visit: Payer: BLUE CROSS/BLUE SHIELD | Attending: Orthopedic Surgery | Admitting: Physical Therapy

## 2016-02-01 DIAGNOSIS — Z9660 Presence of unspecified orthopedic joint implant: Secondary | ICD-10-CM | POA: Insufficient documentation

## 2016-02-01 DIAGNOSIS — M25561 Pain in right knee: Secondary | ICD-10-CM

## 2016-02-01 DIAGNOSIS — M25562 Pain in left knee: Secondary | ICD-10-CM | POA: Diagnosis not present

## 2016-02-01 DIAGNOSIS — Z96649 Presence of unspecified artificial hip joint: Secondary | ICD-10-CM

## 2016-02-01 NOTE — Therapy (Signed)
[place] [facility] [address] [place], [place], [address] Phone: [phone]   Fax:  [phone]  Physical Therapy Treatment  Patient Details  Name: Carol Davis MRN: [id number] Date of Birth: 12/27/1955 No Data Recorded  Encounter Date: 02/01/2016      PT End of Session - 02/01/16 0857    Visit Number 6   Number of Visits 9   Date for PT Re-Evaluation 02/08/16   PT Start Time 0803   PT Stop Time 0845   PT Time Calculation (min) 42 min   Activity Tolerance Patient tolerated treatment well;No increased pain   Behavior During Therapy [facility] for tasks assessed/performed      Past Medical History  Diagnosis Date  . Thyroid disease   . Leucopenia   . Anemia     IDA    No past surgical history on file.  There were no vitals filed for this visit.  Visit Diagnosis:  Right anterior knee pain  S/P hip replacement  Left anterior knee pain      Subjective Assessment - 02/01/16 0807    Subjective Patient reports she has been progressively more active with return to walking, she reports she is continuing to have knee pain, though she is also concerned about fatigue with ambulation. She has been very compliant with her HEP.Marland Kitchen.    Pertinent History Patient reports she had L anterior approach THA on 11/04/2015. She has tried going to a [employer] 1x per week since the start of February. She reports knee pain (which is her chief concern) bilaterally after prolonged sitting and with ascending/descending stairs. She has had this pain for years, but has intensified since her operation.    Limitations Sitting;Standing   How long can you stand comfortably? 20-30 minutes   Patient Stated Goals To complete weight lifting classes and return to the gym.    Currently in Pain? Yes   Pain Score 3    Pain Location Knee   Pain Orientation Right   Pain Descriptors / Indicators Aching;Tightness   Pain Type Chronic pain   Pain Onset More than a month ago   Aggravating Factors  Sitting for a long time    Pain Relieving Factors Stretching, exercising.      TherEx ** Extensive education on sets, reps, intensity for strengthening programming. Educated patient on need to make her routine more challenging (removed leg extensions as these were painful) from sheet she provided from her trainer. Included squats, standing knee flexion with ankle weights, seated knee extensions, hip abductions, step outs. 9-10 for most exercises (30 for step outs)  Patellar mobility evaluated bilaterally - no tenderness noted  Standing quadricep stretch by PT on RLE x 10 for 3 sets with 5" holds per repetition (able to flex past 90 degrees today) -- reported decreased pain, though continued to have tightness in posterior knee Seated calf stretching manually by PT 3 bouts x 30" total (reported significantly less pain and tightness in RLE Educated patient on seated calf stretching with towel 2 bouts x 30" bilaterally (reduced discomfort) -- reported she felt much better afterwards   Leg Press 15# (too easy) progressed to 25# (too easy) total of 10 reps. Progressed to 35# (cuing for foot placement, hip/knee angle) x 10 (moderately challenging.                             PT Education - 02/01/16  5621    Education provided Yes   Education Details Extensive education on HIIT, strength training, and stretching program.    Person(s) Educated Patient   Methods Explanation;Demonstration;Handout   Comprehension Verbalized understanding;Returned demonstration             PT Long Term Goals - 01/04/16 1042    PT LONG TERM GOAL #1   Title Patient will report an LEFS score of greater than 45/80 to demonstrate improved tolerance for ADLs.   Baseline 35/80   Time 4   Period Weeks   Status New   PT LONG TERM GOAL #2   Title Patient will report worst knee pain of 2/10 during functional activities to demonstrate  improved tolerance for ADLs.    Baseline 5/10   Time 6   Period Weeks   Status New   PT LONG TERM GOAL #3   Title Patient will transfer sit to stand without use of hands to demonstrate improved functional strength for ADLs.    Baseline Unable to transfer without use of UEs.    Time 4   Period Weeks   Status New   PT LONG TERM GOAL #4   Title Patient will complete weight lifting session with no increase in pain and full participation to return to recreational activities.    Time 4   Period Weeks   Status New               Plan - 02/01/16 0857    Clinical Impression Statement Patient reports pain/tightness in her posterior knee/calf, which was relieved with manual stretching of her calf (gastroc) musculature. She was able to complete leg press exercise with minimal exercise, and counseled on progressing strengthening programming after she brought in her sheet. Educated to stop performing seated leg extensions (as these were painful).    Pt will benefit from skilled therapeutic intervention in order to improve on the following deficits Abnormal gait;Difficulty walking;Pain;Decreased balance;Decreased strength;Decreased mobility;Decreased activity tolerance   Rehab Potential Good   Clinical Impairments Affecting Rehab Potential Age, chronicity of pain   PT Frequency 2x / week   PT Duration 4 weeks   PT Treatment/Interventions Taping;Therapeutic exercise;Therapeutic activities;Manual techniques;Balance training;Gait training;Stair training   PT Next Visit Plan Quadricep strengthening/stretching, posterior chain strengthening. Adductor stretching and strengthening.    PT Home Exercise Plan See patient instructions.    Consulted and Agree with Plan of Care Patient        Problem List Patient Active Problem List   Diagnosis Date Noted  . Leukopenia 03/16/2015  . Iron deficiency anemia 03/16/2015  . B12 deficiency 03/16/2015   Kerin Ransom, PT, DPT    02/01/2016, 9:16  AM  [facility] PHYSICAL AND SPORTS MEDICINE [address], [place], [address] Phone: [phone]   Fax:  [phone]  Name: Carol Davis MRN: [id number] Date of Birth: Nov 05, 1955

## 2016-02-03 DIAGNOSIS — H0270 Unspecified degenerative disorders of eyelid and periocular area: Secondary | ICD-10-CM | POA: Diagnosis not present

## 2016-02-04 ENCOUNTER — Ambulatory Visit: Payer: BLUE CROSS/BLUE SHIELD | Admitting: Physical Therapy

## 2016-04-12 DIAGNOSIS — H5340 Unspecified visual field defects: Secondary | ICD-10-CM | POA: Diagnosis not present

## 2016-04-12 DIAGNOSIS — H02834 Dermatochalasis of left upper eyelid: Secondary | ICD-10-CM | POA: Diagnosis not present

## 2016-04-12 DIAGNOSIS — H02831 Dermatochalasis of right upper eyelid: Secondary | ICD-10-CM | POA: Diagnosis not present

## 2016-04-12 DIAGNOSIS — H02403 Unspecified ptosis of bilateral eyelids: Secondary | ICD-10-CM | POA: Diagnosis not present

## 2016-04-13 DIAGNOSIS — H02834 Dermatochalasis of left upper eyelid: Secondary | ICD-10-CM | POA: Diagnosis not present

## 2016-06-09 DIAGNOSIS — E669 Obesity, unspecified: Secondary | ICD-10-CM | POA: Diagnosis not present

## 2016-06-09 DIAGNOSIS — E063 Autoimmune thyroiditis: Secondary | ICD-10-CM | POA: Diagnosis not present

## 2016-06-09 DIAGNOSIS — E038 Other specified hypothyroidism: Secondary | ICD-10-CM | POA: Diagnosis not present

## 2016-06-09 DIAGNOSIS — R7301 Impaired fasting glucose: Secondary | ICD-10-CM | POA: Diagnosis not present

## 2016-06-21 DIAGNOSIS — Z01419 Encounter for gynecological examination (general) (routine) without abnormal findings: Secondary | ICD-10-CM | POA: Diagnosis not present

## 2016-06-23 ENCOUNTER — Other Ambulatory Visit: Payer: Self-pay | Admitting: Obstetrics and Gynecology

## 2016-06-23 DIAGNOSIS — N6310 Unspecified lump in the right breast, unspecified quadrant: Secondary | ICD-10-CM

## 2016-07-18 ENCOUNTER — Ambulatory Visit
Admission: RE | Admit: 2016-07-18 | Discharge: 2016-07-18 | Disposition: A | Discharge status: Home / Self Care | Payer: BLUE CROSS/BLUE SHIELD | Source: Ambulatory Visit | Attending: Obstetrics and Gynecology | Admitting: Obstetrics and Gynecology

## 2016-07-18 ENCOUNTER — Other Ambulatory Visit: Payer: Self-pay | Admitting: Obstetrics and Gynecology

## 2016-07-18 DIAGNOSIS — N6310 Unspecified lump in the right breast, unspecified quadrant: Secondary | ICD-10-CM

## 2016-07-18 DIAGNOSIS — N63 Unspecified lump in breast: Secondary | ICD-10-CM | POA: Diagnosis not present

## 2016-07-18 DIAGNOSIS — Z9882 Breast implant status: Secondary | ICD-10-CM | POA: Insufficient documentation

## 2016-07-18 DIAGNOSIS — R922 Inconclusive mammogram: Secondary | ICD-10-CM | POA: Diagnosis not present

## 2016-10-17 DIAGNOSIS — H02834 Dermatochalasis of left upper eyelid: Secondary | ICD-10-CM | POA: Diagnosis not present

## 2016-10-17 DIAGNOSIS — H02831 Dermatochalasis of right upper eyelid: Secondary | ICD-10-CM | POA: Diagnosis not present

## 2016-10-17 DIAGNOSIS — H5340 Unspecified visual field defects: Secondary | ICD-10-CM | POA: Diagnosis not present

## 2016-10-31 HISTORY — PX: AUGMENTATION MAMMAPLASTY: SUR837

## 2016-11-24 DIAGNOSIS — N62 Hypertrophy of breast: Secondary | ICD-10-CM | POA: Diagnosis not present

## 2017-01-02 ENCOUNTER — Ambulatory Visit: Payer: Self-pay | Admitting: Medical

## 2017-01-02 VITALS — BP 125/82 | HR 77 | Temp 97.7°F | Resp 16 | Ht 67.0 in | Wt 195.0 lb

## 2017-01-02 DIAGNOSIS — K120 Recurrent oral aphthae: Secondary | ICD-10-CM

## 2017-01-02 MED ORDER — MAGIC MOUTHWASH: 5.0000 mL | Freq: Three times a day (TID) | ORAL | 0 refills | Status: DC

## 2017-01-02 NOTE — Progress Notes (Signed)
   Subjective:    Patient ID: Carol Davis, female    DOB: 02/08/1956, 61 y.o.   MRN: [id number]  HPI patient with sore inside lower right side of lip x 3 weeks. Not healing.    Review of Systems No fevers, no discharge. Painful, not taking anything for pain.     Objective:   Physical Exam  apthous ulcer noted on right side of lower lip. Swollen with erythema.        Assessment & Plan:  Apthous ulcer  Right side of lower lip. Dukes magic mouth wash-before meals, and as needed swish and spit. Avoid acidic, salty , hot or spicy foods. Take otc advil 400mg  po q 6 hours for pain and swelling. Call if discharge or fever occurs or worsening symptoms. hand written prescription given to patient. Advil 200mg  3 packets given to patient. [id number] exp 1/20 Return to the clinic as needed.

## 2017-02-17 DIAGNOSIS — Z23 Encounter for immunization: Secondary | ICD-10-CM | POA: Diagnosis not present

## 2017-03-29 ENCOUNTER — Other Ambulatory Visit: Payer: Self-pay

## 2017-03-29 DIAGNOSIS — R7301 Impaired fasting glucose: Secondary | ICD-10-CM

## 2017-03-29 DIAGNOSIS — E063 Autoimmune thyroiditis: Secondary | ICD-10-CM

## 2017-03-30 LAB — BASIC METABOLIC PANEL
BUN/Creatinine Ratio: 21 (ref 9–23)
BUN: 13 mg/dL (ref 6–24)
CALCIUM: 8.7 mg/dL (ref 8.7–10.2)
CO2: 23 mmol/L (ref 18–29)
CREATININE: 0.63 mg/dL (ref 0.57–1.00)
Chloride: 105 mmol/L (ref 96–106)
GFR calc Af Amer: 114 mL/min/{1.73_m2} (ref >59)
GFR, EST NON AFRICAN AMERICAN: 98 mL/min/{1.73_m2} (ref >59)
Glucose: 86 mg/dL (ref 65–99)
Potassium: 4.6 mmol/L (ref 3.5–5.2)
Sodium: 143 mmol/L (ref 134–144)

## 2017-03-30 LAB — TSH: TSH: 1.47 u[IU]/mL (ref 0.450–4.500)

## 2017-03-30 LAB — HGB A1C W/O EAG: Hgb A1c MFr Bld: 5 % (ref 4.8–5.6)

## 2017-04-03 DIAGNOSIS — R7301 Impaired fasting glucose: Secondary | ICD-10-CM | POA: Diagnosis not present

## 2017-04-03 DIAGNOSIS — E038 Other specified hypothyroidism: Secondary | ICD-10-CM | POA: Diagnosis not present

## 2017-04-03 DIAGNOSIS — E669 Obesity, unspecified: Secondary | ICD-10-CM | POA: Diagnosis not present

## 2017-04-03 DIAGNOSIS — E063 Autoimmune thyroiditis: Secondary | ICD-10-CM | POA: Diagnosis not present

## 2017-05-17 ENCOUNTER — Encounter: Payer: Self-pay | Admitting: Medical

## 2017-05-17 ENCOUNTER — Ambulatory Visit: Payer: Self-pay | Admitting: Medical

## 2017-05-17 VITALS — BP 114/72 | HR 88 | Temp 98.1°F | Wt 199.2 lb

## 2017-05-17 DIAGNOSIS — H65191 Other acute nonsuppurative otitis media, right ear: Secondary | ICD-10-CM

## 2017-05-17 DIAGNOSIS — J069 Acute upper respiratory infection, unspecified: Secondary | ICD-10-CM

## 2017-05-17 DIAGNOSIS — R059 Cough, unspecified: Secondary | ICD-10-CM

## 2017-05-17 DIAGNOSIS — R05 Cough: Secondary | ICD-10-CM

## 2017-05-17 MED ORDER — AZITHROMYCIN 250 MG PO TABS: ORAL_TABLET | ORAL | 0 refills | Status: DC

## 2017-05-17 MED ORDER — BENZONATATE 100 MG PO CAPS: 100.0000 mg | ORAL_CAPSULE | Freq: Three times a day (TID) | ORAL | 0 refills | Status: DC | PRN

## 2017-05-17 NOTE — Progress Notes (Signed)
   Subjective:    Patient ID: Carol Davis, female    DOB: 07/02/1956, 61 y.o.   MRN: [id number]  HPI  61 yo started Monday, exposed to paint and mold , sleeping with her dog and then traveling.  Exposed  To sick colleagues. Cough, runny nose, nasal congestion, sore throat right side. General malaise per patient.  Review of Systems  Constitutional: Positive for fatigue. Negative for chills and fever.  HENT: Positive for congestion, rhinorrhea and sore throat. Negative for postnasal drip, sinus pain, sinus pressure and trouble swallowing.   Eyes: Negative for discharge and itching.  Respiratory: Positive for cough, shortness of breath and wheezing.   Cardiovascular: Negative for chest pain.  Gastrointestinal: Negative for abdominal pain.  Endocrine: Negative for polydipsia, polyphagia and polyuria.  Genitourinary: Negative for hematuria.  Musculoskeletal: Negative for myalgias.  Skin: Negative for rash.  Neurological: Positive for headaches. Negative for dizziness and syncope.  Hematological: Positive for adenopathy.  Psychiatric/Behavioral: Negative for behavioral problems, confusion and hallucinations.      right ear pain Objective:   Physical Exam  Constitutional: She is oriented to person, place, and time. She appears well-developed and well-nourished. She has a sickly appearance.  HENT:  Head: Normocephalic and atraumatic.  Right Ear: External ear normal. Tympanic membrane is erythematous. A middle ear effusion is present.  Left Ear: External ear normal.  Nose: Mucosal edema and rhinorrhea present.  Mouth/Throat: Uvula is midline. Posterior oropharyngeal erythema present.  Eyes: Pupils are equal, round, and reactive to light. Conjunctivae and EOM are normal.  Neck: Normal range of motion. Neck supple.  Cardiovascular: Normal rate, regular rhythm and normal heart sounds.  Exam reveals no gallop and no friction rub.   No murmur heard. Pulmonary/Chest: Effort normal and  breath sounds normal. She has no wheezes.  Musculoskeletal: Normal range of motion.  Lymphadenopathy:    She has no cervical adenopathy.  Neurological: She is alert and oriented to person, place, and time.  Skin: Skin is warm and dry.  Psychiatric: She has a normal mood and affect. Her behavior is normal. Judgment and thought content normal.     Right ear dark and mild erythema Right sided redness of the posterior pharynx.   harsh breath sounds,  No wheezing, cough noted in room. Assessment & Plan:  Sinusitis , Upper Respiratory Infection. Right ear otitis media. and pharyngitis. E-prescribed Zpak 250 mg  2 tablets by mouth day 1  then 1 tablets days 2-5 take with food #6 no refills Benzonatate one tablet by mouth three times a day as needed for cough  #30 no refills OTC Claritin 10 mg take as directed and stop benadryl ( was only using at night time). Take OTC Tylenol or Advil or Aleve as directed for aches and if not feeling well.  Return in 3-5 days if not improving.

## 2017-05-17 NOTE — Patient Instructions (Addendum)
otiti Upper Respiratory Infection, Adult Most upper respiratory infections (URIs) are caused by a virus. A URI affects the nose, throat, and upper air passages. The most common type of URI is often called "the common cold." Follow these instructions at home:  Take medicines only as told by your doctor.  Gargle warm saltwater or take cough drops to comfort your throat as told by your doctor.  Use a warm mist humidifier or inhale steam from a shower to increase air moisture. This may make it easier to breathe.  Drink enough fluid to keep your pee (urine) clear or pale yellow.  Eat soups and other clear broths.  Have a healthy diet.  Rest as needed.  Go back to work when your fever is gone or your doctor says it is okay. ? You may need to stay home longer to avoid giving your URI to others. ? You can also wear a face mask and wash your hands often to prevent spread of the virus.  Use your inhaler more if you have asthma.  Do not use any tobacco products, including cigarettes, chewing tobacco, or electronic cigarettes. If you need help quitting, ask your doctor. Contact a doctor if:  You are getting worse, not better.  Your symptoms are not helped by medicine.  You have chills.  You are getting more short of breath.  You have brown or red mucus.  You have yellow or brown discharge from your nose.  You have pain in your face, especially when you bend forward.  You have a fever.  You have puffy (swollen) neck glands.  You have pain while swallowing.  You have white areas in the back of your throat. Get help right away if:  You have very bad or constant: ? Headache. ? Ear pain. ? Pain in your forehead, behind your eyes, and over your cheekbones (sinus pain). ? Chest pain.  You have long-lasting (chronic) lung disease and any of the following: ? Wheezing. ? Long-lasting cough. ? Coughing up blood. ? A change in your usual mucus.  You have a stiff neck.  You  have changes in your: ? Vision. ? Hearing. ? Thinking. ? Mood. This information is not intended to replace advice given to you by your health care provider. Make sure you discuss any questions you have with your health care provider. Document Released: 04/04/2008 Document Revised: 06/19/2016 Document Reviewed: 01/22/2014 Elsevier Interactive Patient Education  2018 [employer].  Otitis Media, Adult Otitis media is redness, soreness, and puffiness (swelling) in the space just behind your eardrum (middle ear). It may be caused by allergies or infection. It often happens along with a cold. Follow these instructions at home:  Take your medicine as told. Finish it even if you start to feel better.  Only take over-the-counter or prescription medicines for pain, discomfort, or fever as told by your doctor.  Follow up with your doctor as told. Contact a doctor if:  You have otitis media only in one ear, or bleeding from your nose, or both.  You notice a lump on your neck.  You are not getting better in 3-5 days.  You feel worse instead of better. Get help right away if:  You have pain that is not helped with medicine.  You have puffiness, redness, or pain around your ear.  You get a stiff neck.  You cannot move part of your face (paralysis).  You notice that the bone behind your ear hurts when you touch it. This  information is not intended to replace advice given to you by your health care provider. Make sure you discuss any questions you have with your health care provider. Document Released: 04/04/2008 Document Revised: 03/24/2016 Document Reviewed: 05/14/2013 Elsevier Interactive Patient Education  2017 [employer]   OTC Tylenol or Advil or Aleve take as directed.

## 2017-06-21 ENCOUNTER — Ambulatory Visit (INDEPENDENT_AMBULATORY_CARE_PROVIDER_SITE_OTHER): Payer: BLUE CROSS/BLUE SHIELD | Admitting: Obstetrics and Gynecology

## 2017-06-21 ENCOUNTER — Encounter: Payer: Self-pay | Admitting: Obstetrics and Gynecology

## 2017-06-21 VITALS — BP 100/66 | HR 73 | Ht 67.0 in | Wt 202.1 lb

## 2017-06-21 DIAGNOSIS — Z202 Contact with and (suspected) exposure to infections with a predominantly sexual mode of transmission: Secondary | ICD-10-CM

## 2017-06-21 DIAGNOSIS — Z01419 Encounter for gynecological examination (general) (routine) without abnormal findings: Secondary | ICD-10-CM

## 2017-06-21 DIAGNOSIS — B009 Herpesviral infection, unspecified: Secondary | ICD-10-CM

## 2017-06-21 DIAGNOSIS — Z1231 Encounter for screening mammogram for malignant neoplasm of breast: Secondary | ICD-10-CM | POA: Diagnosis not present

## 2017-06-21 DIAGNOSIS — Z8619 Personal history of other infectious and parasitic diseases: Secondary | ICD-10-CM

## 2017-06-21 DIAGNOSIS — Z1211 Encounter for screening for malignant neoplasm of colon: Secondary | ICD-10-CM | POA: Diagnosis not present

## 2017-06-21 DIAGNOSIS — R638 Other symptoms and signs concerning food and fluid intake: Secondary | ICD-10-CM

## 2017-06-21 DIAGNOSIS — Z1239 Encounter for other screening for malignant neoplasm of breast: Secondary | ICD-10-CM

## 2017-06-21 MED ORDER — VALACYCLOVIR HCL 500 MG PO TABS: 500.0000 mg | ORAL_TABLET | Freq: Two times a day (BID) | ORAL | 3 refills | Status: DC

## 2017-06-21 NOTE — Patient Instructions (Signed)
1.  Pap smear is done. 2.  Mammogram is ordered. 3.  ttool guaiac cards are given for colon cancer screening 4.  STD screen is obtained today. 5.  Recommended calcium and vitamin D supplementation twice a day. 6.  Continue with healthy eating and exercise with the goal of 10 pound weight loss in 1 year. 7.  .  Return in one year for annual exam   Health Maintenance for Postmenopausal Women Menopause is a normal process in which your reproductive ability comes to an end. This process happens gradually over a span of months to years, usually between the ages of 38 and 56. Menopause is complete when you have missed 12 consecutive menstrual periods. It is important to talk with your health care provider about some of the most common conditions that affect postmenopausal women, such as heart disease, cancer, and bone loss (osteoporosis). Adopting a healthy lifestyle and getting preventive care can help to promote your health and wellness. Those actions can also lower your chances of developing some of these common conditions. What should I know about menopause? During menopause, you may experience a number of symptoms, such as:  Moderate-to-severe hot flashes.  Night sweats.  Decrease in sex drive.  Mood swings.  Headaches.  Tiredness.  Irritability.  Memory problems.  Insomnia.  Choosing to treat or not to treat menopausal changes is an individual decision that you make with your health care provider. What should I know about hormone replacement therapy and supplements? Hormone therapy products are effective for treating symptoms that are associated with menopause, such as hot flashes and night sweats. Hormone replacement carries certain risks, especially as you become older. If you are thinking about using estrogen or estrogen with progestin treatments, discuss the benefits and risks with your health care provider. What should I know about heart disease and stroke? Heart disease,  heart attack, and stroke become more likely as you age. This may be due, in part, to the hormonal changes that your body experiences during menopause. These can affect how your body processes dietary fats, triglycerides, and cholesterol. Heart attack and stroke are both medical emergencies. There are many things that you can do to help prevent heart disease and stroke:  Have your blood pressure checked at least every 1-2 years. High blood pressure causes heart disease and increases the risk of stroke.  If you are 72-56 years old, ask your health care provider if you should take aspirin to prevent a heart attack or a stroke.  Do not use any tobacco products, including cigarettes, chewing tobacco, or electronic cigarettes. If you need help quitting, ask your health care provider.  It is important to eat a healthy diet and maintain a healthy weight. ? Be sure to include plenty of vegetables, fruits, low-fat dairy products, and lean protein. ? Avoid eating foods that are high in solid fats, added sugars, or salt (sodium).  Get regular exercise. This is one of the most important things that you can do for your health. ? Try to exercise for at least 150 minutes each week. The type of exercise that you do should increase your heart rate and make you sweat. This is known as moderate-intensity exercise. ? Try to do strengthening exercises at least twice each week. Do these in addition to the moderate-intensity exercise.  Know your numbers.Ask your health care provider to check your cholesterol and your blood glucose. Continue to have your blood tested as directed by your health care provider.  What should  I know about cancer screening? There are several types of cancer. Take the following steps to reduce your risk and to catch any cancer development as early as possible. Breast Cancer  Practice breast self-awareness. ? This means understanding how your breasts normally appear and feel. ? It also  means doing regular breast self-exams. Let your health care provider know about any changes, no matter how small.  If you are 110 or older, have a clinician do a breast exam (clinical breast exam or CBE) every year. Depending on your age, family history, and medical history, it may be recommended that you also have a yearly breast X-ray (mammogram).  If you have a family history of breast cancer, talk with your health care provider about genetic screening.  If you are at high risk for breast cancer, talk with your health care provider about having an MRI and a mammogram every year.  Breast cancer (BRCA) gene test is recommended for women who have family members with BRCA-related cancers. Results of the assessment will determine the need for genetic counseling and BRCA1 and for BRCA2 testing. BRCA-related cancers include these types: ? Breast. This occurs in males or females. ? Ovarian. ? Tubal. This may also be called fallopian tube cancer. ? Cancer of the abdominal or pelvic lining (peritoneal cancer). ? Prostate. ? Pancreatic.  Cervical, Uterine, and Ovarian Cancer Your health care provider may recommend that you be screened regularly for cancer of the pelvic organs. These include your ovaries, uterus, and vagina. This screening involves a pelvic exam, which includes checking for microscopic changes to the surface of your cervix (Pap test).  For women ages 21-65, health care providers may recommend a pelvic exam and a Pap test every three years. For women ages 7-65, they may recommend the Pap test and pelvic exam, combined with testing for human papilloma virus (HPV), every five years. Some types of HPV increase your risk of cervical cancer. Testing for HPV may also be done on women of any age who have unclear Pap test results.  Other health care providers may not recommend any screening for nonpregnant women who are considered low risk for pelvic cancer and have no symptoms. Ask your health  care provider if a screening pelvic exam is right for you.  If you have had past treatment for cervical cancer or a condition that could lead to cancer, you need Pap tests and screening for cancer for at least 20 years after your treatment. If Pap tests have been discontinued for you, your risk factors (such as having a new sexual partner) need to be reassessed to determine if you should start having screenings again. Some women have medical problems that increase the chance of getting cervical cancer. In these cases, your health care provider may recommend that you have screening and Pap tests more often.  If you have a family history of uterine cancer or ovarian cancer, talk with your health care provider about genetic screening.  If you have vaginal bleeding after reaching menopause, tell your health care provider.  There are currently no reliable tests available to screen for ovarian cancer.  Lung Cancer Lung cancer screening is recommended for adults 19-64 years old who are at high risk for lung cancer because of a history of smoking. A yearly low-dose CT scan of the lungs is recommended if you:  Currently smoke.  Have a history of at least 30 pack-years of smoking and you currently smoke or have quit within the past 15 years.  A pack-year is smoking an average of one pack of cigarettes per day for one year.  Yearly screening should:  Continue until it has been 15 years since you quit.  Stop if you develop a health problem that would prevent you from having lung cancer treatment.  Colorectal Cancer  This type of cancer can be detected and can often be prevented.  Routine colorectal cancer screening usually begins at age 51 and continues through age 24.  If you have risk factors for colon cancer, your health care provider may recommend that you be screened at an earlier age.  If you have a family history of colorectal cancer, talk with your health care provider about genetic  screening.  Your health care provider may also recommend using home test kits to check for hidden blood in your stool.  A small camera at the end of a tube can be used to examine your colon directly (sigmoidoscopy or colonoscopy). This is done to check for the earliest forms of colorectal cancer.  Direct examination of the colon should be repeated every 5-10 years until age 15. However, if early forms of precancerous polyps or small growths are found or if you have a family history or genetic risk for colorectal cancer, you may need to be screened more often.  Skin Cancer  Check your skin from head to toe regularly.  Monitor any moles. Be sure to tell your health care provider: ? About any new moles or changes in moles, especially if there is a change in a mole's shape or color. ? If you have a mole that is larger than the size of a pencil eraser.  If any of your family members has a history of skin cancer, especially at a young age, talk with your health care provider about genetic screening.  Always use sunscreen. Apply sunscreen liberally and repeatedly throughout the day.  Whenever you are outside, protect yourself by wearing long sleeves, pants, a wide-brimmed hat, and sunglasses.  What should I know about osteoporosis? Osteoporosis is a condition in which bone destruction happens more quickly than new bone creation. After menopause, you may be at an increased risk for osteoporosis. To help prevent osteoporosis or the bone fractures that can happen because of osteoporosis, the following is recommended:  If you are 53-80 years old, get at least 1,000 mg of calcium and at least 600 mg of vitamin D per day.  If you are older than age 9 but younger than age 62, get at least 1,200 mg of calcium and at least 600 mg of vitamin D per day.  If you are older than age 7, get at least 1,200 mg of calcium and at least 800 mg of vitamin D per day.  Smoking and excessive alcohol intake  increase the risk of osteoporosis. Eat foods that are rich in calcium and vitamin D, and do weight-bearing exercises several times each week as directed by your health care provider. What should I know about how menopause affects my mental health? Depression may occur at any age, but it is more common as you become older. Common symptoms of depression include:  Low or sad mood.  Changes in sleep patterns.  Changes in appetite or eating patterns.  Feeling an overall lack of motivation or enjoyment of activities that you previously enjoyed.  Frequent crying spells.  Talk with your health care provider if you think that you are experiencing depression. What should I know about immunizations? It is important that you  get and maintain your immunizations. These include:  Tetanus, diphtheria, and pertussis (Tdap) booster vaccine.  Influenza every year before the flu season begins.  Pneumonia vaccine.  Shingles vaccine.  Your health care provider may also recommend other immunizations. This information is not intended to replace advice given to you by your health care provider. Make sure you discuss any questions you have with your health care provider. Document Released: 12/09/2005 Document Revised: 05/06/2016 Document Reviewed: 07/21/2015 Elsevier Interactive Patient Education  2018 [employer].

## 2017-06-21 NOTE — Progress Notes (Signed)
ANNUAL PREVENTATIVE CARE GYN  ENCOUNTER NOTE  Subjective:       Carol Davis is a 61 y.o. G0P0 female here for a routine annual gynecologic exam.  Current complaints: 1.  Refill valtrex  2. Std screening 3. Phentermine Questions  61 year old white female, para 0, menopausal, on no hormone replacement therapy, presents for establishment of care. 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  She did not have sex following the diagnosis until several years ago when she got into a monogamous relationship; subsequently was diagnosed with gonorrhea which was treated.  Patient has not had test of cure.  Screening.  Patient denies vasomotor symptoms.  She denied urogenital atrophy symptoms.  Bowel function is normal; she has a remote history of ulcerative colitis years ago. She does have history of hypothyroidism. Patient had history of breast implants and is status post implant removal following rupture of the implants.  She has not had any abnormal mammograms.   Gynecologic History No LMP recorded. Patient is postmenopausal. Contraception: post menopausal status Last Pap: 2017 wnl . Results were: normal Last mammogram: 2017 birad 1. Results were: normal  Obstetric History OB History  Gravida Para Term Preterm AB Living  0            SAB TAB Ectopic Multiple Live Births                   Past Medical History:  Diagnosis Date  . Anemia    IDA  . HSV-2 (herpes simplex virus 2) infection   . Leucopenia   . Thyroid disease     Past Surgical History:  Procedure Laterality Date  . AUGMENTATION MAMMAPLASTY  10/2016   removed   . TOTAL HIP ARTHROPLASTY Left     Current Outpatient Prescriptions on File Prior to Visit  Medication Sig Dispense Refill  . Cholecalciferol (VITAMIN D-1000 MAX ST) 1000 UNITS tablet Take by mouth.    . DiphenhydrAMINE HCl (BENADRYL ALLERGY PO) Take by mouth.    . levothyroxine (LEVOXYL) 100 MCG tablet Take 100  mcg by mouth once.    . valACYclovir (VALTREX) 500 MG tablet TAKE ONE TABLET TWICE A DAY WITH FOOD     No current facility-administered medications on file prior to visit.     No Known Allergies  Social History   Social History  . Marital status: Single    Spouse name: N/A  . Number of children: N/A  . Years of education: N/A   Occupational History  . Not on file.   Social History Main Topics  . Smoking status: Never Smoker  . Smokeless tobacco: Never Used     Comment: quit 30 years ago  . Alcohol use No  . Drug use: No  . Sexual activity: Not on file   Other Topics Concern  . Not on file   Social History Narrative  . No narrative on file    Family History  Problem Relation Age of Onset  . Diabetes Mother   . Diabetes Father   . Diabetes Brother   . Breast cancer Neg Hx     The following portions of the patient's history were reviewed and updated as appropriate: allergies, current medications, past family history, past medical history, past social history, past surgical history and problem list.  Review of Systems Review of Systems  Constitutional: Negative.        No vasomotor symptoms  HENT: Negative.   Eyes: Negative.  Respiratory: Negative.   Cardiovascular: Negative.   Gastrointestinal: Negative.        Remote history of ulcerative colitis, inactive  Genitourinary: Negative.        History of genital herpes; no recent outbreaks  Musculoskeletal: Negative.   Skin:       Patient gets chronic irritation with itching bites and mosquito bites  Neurological: Negative.   Endo/Heme/Allergies: Negative.   Psychiatric/Behavioral: Negative.     Objective:   BP 100/66   Pulse 73   Ht 5\' 7"  (1.702 m)   Wt 202 lb 1.6 oz (91.7 kg)   BMI 31.65 kg/m  CONSTITUTIONAL: Well-developed, well-nourished female in no acute distress.  PSYCHIATRIC: Normal mood and affect. Normal behavior. Normal judgment and thought content. NEUROLGIC: Alert and oriented to  person, place, and time. Normal muscle tone coordination. No cranial nerve deficit noted. HENT:  Normocephalic, atraumatic, External right and left ear normal. Oropharynx is clear and moist EYES: Conjunctivae and EOM are normal. No scleral icterus.  NECK: Normal range of motion, supple, no masses.  Normal thyroid.  SKIN: Skin is warm and dry. No rash noted. Not diaphoretic. No erythema. No pallor. CARDIOVASCULAR: Normal heart rate noted, regular rhythm, no murmur. RESPIRATORY: Clear to auscultation bilaterally. Effort and breath sounds normal, no problems with respiration noted. BREASTS: Symmetric in size. No masses, skin changes, nipple drainage, or lymphadenopathy.Post mammoplasty, scarring, well-healed. ABDOMEN: Soft, normal bowel sounds, no distention noted.  No tenderness, rebound or guarding.  BLADDER: Normal PELVIC:  External Genitalia: Normal  BUS: Normal  Vagina: Mild atrophy  Cervix: Normal; No lesions; no discharge  ` Uterus: Normal; Midplane, normal size and shape, mobile, nontender  Adnexa: Normal  RV: External Exam NormaI, No Rectal Masses and Normal Sphincter tone  MUSCULOSKELETAL: Normal range of motion. No tenderness.  No cyanosis, clubbing, or edema.  2+ distal pulses. LYMPHATIC: No Axillary, Supraclavicular, or Inguinal Adenopathy.    Assessment:   Annual gynecologic examination 61 y.o. Contraception: post menopausal status bmi- 31 Menopause. History of ulcerative colitis, inactive. History of HSV 2, infrequent outbreaks. History of positive GC, treated, without test of cure  Plan:  Pap: Pap Co Test and GC/CT NAAT Mammogram: Ordered Stool Guaiac Testing:  Ordered Labs: std screen per pt request Routine preventative health maintenance measures emphasized: Exercise/Diet/Weight control, Tobacco Warnings, Alcohol/Substance use risks, Stress Management and Peer Pressure Issues Return to Clinic - 1 Year   Crystal [place], CMA  Herold Harms, MD  Note:  This dictation was prepared with Dragon dictation along with smaller phrase technology. Any transcriptional errors that result from this process are unintentional.

## 2017-06-22 LAB — RPR: RPR: NONREACTIVE

## 2017-06-22 LAB — HEPATITIS C ANTIBODY

## 2017-06-22 LAB — HIV ANTIBODY (ROUTINE TESTING W REFLEX): HIV Screen 4th Generation wRfx: NONREACTIVE

## 2017-06-22 LAB — HEPATITIS B SURFACE ANTIGEN: Hepatitis B Surface Ag: NEGATIVE

## 2017-06-23 LAB — PAPIG, CTNG, RFXHPVALL, 16/18
CHLAMYDIA, NUC. ACID AMP: NEGATIVE
GONOCOCCUS BY NUCLEIC ACID AMP: NEGATIVE
PAP Smear Comment: 0

## 2017-06-26 DIAGNOSIS — Z23 Encounter for immunization: Secondary | ICD-10-CM | POA: Diagnosis not present

## 2017-07-07 ENCOUNTER — Encounter: Payer: Self-pay | Admitting: Adult Health

## 2017-07-07 ENCOUNTER — Ambulatory Visit: Payer: BLUE CROSS/BLUE SHIELD | Admitting: Adult Health

## 2017-07-07 VITALS — BP 104/80 | HR 71 | Temp 97.5°F | Wt 199.6 lb

## 2017-07-07 DIAGNOSIS — W57XXXA Bitten or stung by nonvenomous insect and other nonvenomous arthropods, initial encounter: Secondary | ICD-10-CM

## 2017-07-07 DIAGNOSIS — B353 Tinea pedis: Secondary | ICD-10-CM

## 2017-07-07 MED ORDER — NYSTATIN 100000 UNIT/GM EX CREA: 1.0000 "application " | TOPICAL_CREAM | Freq: Two times a day (BID) | CUTANEOUS | 0 refills | Status: DC

## 2017-07-07 NOTE — Patient Instructions (Signed)
Athlete's Foot Athlete's foot (tinea pedis) is a fungal infection of the skin on the feet. It often occurs on the skin that is between or underneath the toes. It can also occur on the soles of the feet. The infection can spread from person to person (is contagious). Follow these instructions at home:  Apply or take over-the-counter and prescription medicines only as told by your doctor.  Keep all follow-up visits as told by your doctor. This is important.  Do not scratch your feet.  Keep your feet dry: ? Wear cotton or wool socks. Change your socks every day or if they become wet. ? Wear shoes that allow air to move around, such as sandals or canvas tennis shoes.  Wash and dry your feet: ? Every day or as told by your doctor. ? After exercising. ? Including the area between your toes.  Wear sandals in wet areas, such as locker rooms and shared showers.  Do not share any of these items: ? Towels. ? Nail clippers. ? Other personal items that touch your feet.  If you have diabetes, keep your blood sugar under control. Contact a doctor if:  You have a fever.  You have swelling, soreness, warmth, or redness in your foot.  You are not getting better with treatment.  Your symptoms get worse.  You have new symptoms. This information is not intended to replace advice given to you by your health care provider. Make sure you discuss any questions you have with your health care provider. Document Released: 04/04/2008 Document Revised: 03/24/2016 Document Reviewed: 04/20/2015 Elsevier Interactive Patient Education  2018 [employer]. IT sales professional, Adult An insect bite can make your skin red, itchy, and swollen. Some insects can spread disease to people with a bite. However, most insect bites do not lead to disease, and most are not serious. Follow these instructions at home: Bite area care  Do not scratch the bite area.  Keep the bite area clean and dry.  Wash the bite area every  day with soap and water as told by your doctor.  Check the bite area every day for signs of infection. Check for: ? More redness, swelling, or pain. ? Fluid or blood. ? Warmth. ? Pus. Managing pain, itching, and swelling  You may put any of these on the bite area as told by your doctor: ? A baking soda paste. ? Cortisone cream. ? Calamine lotion.  If directed, put ice on the bite area. ? Put ice in a plastic bag. ? Place a towel between your skin and the bag. ? Leave the ice on for 20 minutes, 2-3 times a day. Medicines  Take medicines or put medicines on your skin only as told by your doctor.  If you were prescribed an antibiotic medicine, use it as told by your doctor. Do not stop using the antibiotic even if your condition improves. General instructions  Keep all follow-up visits as told by your doctor. This is important. How is this prevented? To help you have a lower risk of insect bites:  When you are outside, wear clothing that covers your arms and legs.  Use insect repellent. The best insect repellents have: ? An active ingredient of DEET, picaridin, oil of lemon eucalyptus (OLE), or IR3535. ? Higher amounts of DEET or another active ingredient than other repellents have.  If your home windows do not have screens, think about putting some in.  Contact a doctor if:  You have more redness, swelling, or pain  in the bite area.  You have fluid, blood, or pus coming from the bite area.  The bite area feels warm.  You have a fever. Get help right away if:  You have joint pain.  You have a rash.  You have shortness of breath.  You feel more tired or sleepy than you normally do.  You have neck pain.  You have a headache.  You feel weaker than you normally do.  You have chest pain.  You have pain in your belly.  You feel sick to your stomach (nauseous) or you throw up (vomit). Summary  An insect bite can make your skin red, itchy, and swollen.  Do  not scratch the bite area, and keep it clean and dry.  Ice can help with pain and itching from the bite. This information is not intended to replace advice given to you by your health care provider. Make sure you discuss any questions you have with your health care provider. Document Released: 10/14/2000 Document Revised: 05/19/2016 Document Reviewed: 03/04/2015 Elsevier Interactive Patient Education  2018 [employer]

## 2017-07-07 NOTE — Progress Notes (Signed)
Subjective:     Patient ID: Carol Davis, female   DOB: 12/18/1955, 61 y.o.   MRN: [id number]  HPI Patient is a 61 year old female in no acute distress who presents to the office with reports that she reports she feels as if she was bit by chiggers in the woods two months ago.  She had them on her ankles/ stomach and chest/ lower back and they have improved and are scabbed over.   She reports itching between her toes on her right foot that is bothering her the most.   She has an appointment with Dermatology in November for skin treatment.  She has Fungus on her toenails that she is seeing podiatry for and her next appointment is on 07/14/17 - she denies any current treatment just having her toenails removed.   Hydrocortisone - she has. Prescription strength and is using on bug bites to lower legs. She has taken Benadryl at night with relief to itch.  She denies any recent travel. Illness. She denies any fever chills nausea or vomiting.    Breast implants removed January 2018.No issues with surgery or site. She denies having any breast cancer. She had breast implants at age 61 ad had them removed due age of implants per her report.  She reports she sees her family doctor regularly.  She denies any current treatment for any diseases.  She denies any recent illness, wounds, infections, or exposures. She denies any new products, or medications. She has not ad any recent travel and she reports " I feel great".   Blood pressure 104/80, pulse 71, temperature (!) 97.5 F (36.4 C), temperature source Tympanic, weight 199 lb 9.6 oz (90.5 kg), SpO2 95 %. Recheck temperature 97.8 tympanic.   Current Outpatient Prescriptions:  .  Cholecalciferol (VITAMIN D-1000 MAX ST) 1000 UNITS tablet, Take by mouth., Disp: , Rfl:  .  DiphenhydrAMINE HCl (BENADRYL ALLERGY PO), Take by mouth., Disp: , Rfl:  .  levothyroxine (LEVOXYL) 100 MCG tablet, Take 100 mcg by mouth once., Disp: , Rfl:  .  valACYclovir  (VALTREX) 500 MG tablet, Take 1 tablet (500 mg total) by mouth 2 (two) times daily. TAKE ONE TABLET TWICE A DAY WITH FOOD, Disp: 10 tablet, Rfl: 3  Review of Systems  Constitutional: Negative.   HENT: Negative.   Eyes: Negative.   Respiratory: Negative.   Cardiovascular: Negative.   Gastrointestinal: Negative.   Endocrine: Negative.   Genitourinary: Negative.   Musculoskeletal: Negative.   Skin: Positive for rash (scattered pink  areas on chest tat are few and itch some/ few scabbed over areas scatterred on lower legs/ankles). Negative for color change and pallor.  Allergic/Immunologic: Negative for environmental allergies, food allergies and immunocompromised state (history of documented leukopenia/ she reports has been normal last visit with her MD. ).  Neurological: Negative.   Hematological: Negative.   Psychiatric/Behavioral: Negative.        Objective:   Physical Exam  Constitutional: She is oriented to person, place, and time. She appears well-developed and well-nourished. No distress.  HENT:  Head: Normocephalic and atraumatic.  Right Ear: External ear normal.  Left Ear: External ear normal.  Nose: Nose normal.  Mouth/Throat: Oropharynx is clear and moist. No oropharyngeal exudate.  Eyes: Pupils are equal, round, and reactive to light. Conjunctivae and EOM are normal. Right eye exhibits no discharge. Left eye exhibits no discharge. No scleral icterus.  Neck: Normal range of motion. Neck supple. No JVD present. No tracheal deviation present. No  thyromegaly present.  Cardiovascular: Normal rate, regular rhythm, normal heart sounds and intact distal pulses.  Exam reveals no gallop and no friction rub.   No murmur heard. Pulmonary/Chest: Effort normal and breath sounds normal. No stridor. No respiratory distress. She has no wheezes. She has no rales. She exhibits no tenderness.    Healed scars from breast implant removal January 2018.   Abdominal: Soft.  Lymphadenopathy:     She has no cervical adenopathy.  Neurological: She is alert and oriented to person, place, and time. She has normal reflexes. She displays normal reflexes. No cranial nerve deficit. She exhibits normal muscle tone. Coordination normal.  Skin: Skin is warm and dry. She is not diaphoretic. No erythema.     3 to 4 Few scattered pink macular pinpoint marks on upper chest - marked on diagram. No drainage or warmth.   Few scattered pinpoint scabbed areas lower legs and ankle. Without erythema or drainage.   Right foot between 2nd and 3rd toes rash with moderate  pruritic small area 0.5cm x 0.5 cm pink macular.  NO PURPURA NOTED ON ANY SKIN.  Psychiatric: She has a normal mood and affect. Her behavior is normal. Judgment and thought content normal.       Assessment:    Bug bite, initial encounter  Tinea pedis of right foot  Bug bites healing and scattered on lower legs and few on upper chest. Patient has been in woods.       Plan:   Meds ordered this encounter  Medications  . nystatin cream (MYCOSTATIN)    Sig: Apply 1 application topically 2 (two) times daily. Between toes for rash    Dispense:  30 g    Refill:  0  Other anti- yeast medications were not on formulary- will try this since area is small and mild.  Offered prescription Hydrocortisone for bug bites but patient reports she already has and is using.   Claritin Daily for itch as directed on package. May stop Benadryl.  To mild area of yeast between toes.   Return to clinic at any time  if any new symptoms change, worsen or do not improve. Symptoms should improve  within 72 hours and if not improving you should call for an appointment at the clinic or if worsening symptoms be seen in urgent care/emergency room  if clinic is closed. If any emergent symptoms call 911 at anytime. Patient verbalized above understanding of all instructions and denies any further questions at this time.    Patient verbalized understanding of  instructions and denies any further questions at this time.

## 2017-07-14 ENCOUNTER — Ambulatory Visit: Payer: Self-pay | Admitting: Podiatry

## 2017-07-21 DIAGNOSIS — M170 Bilateral primary osteoarthritis of knee: Secondary | ICD-10-CM | POA: Diagnosis not present

## 2017-07-21 DIAGNOSIS — M25562 Pain in left knee: Secondary | ICD-10-CM | POA: Diagnosis not present

## 2017-07-21 DIAGNOSIS — M25561 Pain in right knee: Secondary | ICD-10-CM | POA: Diagnosis not present

## 2017-07-25 ENCOUNTER — Encounter: Payer: Self-pay | Admitting: Podiatry

## 2017-07-25 ENCOUNTER — Ambulatory Visit (INDEPENDENT_AMBULATORY_CARE_PROVIDER_SITE_OTHER): Payer: BLUE CROSS/BLUE SHIELD | Admitting: Podiatry

## 2017-07-25 DIAGNOSIS — L603 Nail dystrophy: Secondary | ICD-10-CM

## 2017-07-25 DIAGNOSIS — L608 Other nail disorders: Secondary | ICD-10-CM | POA: Diagnosis not present

## 2017-07-25 DIAGNOSIS — L601 Onycholysis: Secondary | ICD-10-CM | POA: Diagnosis not present

## 2017-07-26 NOTE — Progress Notes (Signed)
   Subjective: Patient presents today for possible treatment and evaluation of fungal nails bilaterally 1 through 5. Patient states that the nails have been discolored and thickened for greater than 1 month. Patient presents today for further treatment and evaluation.   Past Medical History:  Diagnosis Date  . Anemia    IDA  . HSV-2 (herpes simplex virus 2) infection   . Leucopenia   . STD (sexually transmitted disease)    pos gonorrhea  . Thyroid disease     Objective: Physical Exam General: The patient is alert and oriented x3 in no acute distress.  Dermatology: Hyperkeratotic, discolored, thickened, onychodystrophy of nails noted bilaterally.  Skin is warm, dry and supple bilateral lower extremities. Negative for open lesions or macerations.  Vascular: Palpable pedal pulses bilaterally. No edema or erythema noted. Capillary refill within normal limits.  Neurological: Epicritic and protective threshold grossly intact bilaterally.   Musculoskeletal Exam: Range of motion within normal limits to all pedal and ankle joints bilateral. Muscle strength 5/5 in all groups bilateral.   Assessment: #1 onychodystrophy bilateral toenails #2 possible onychomycosis #3 hyperkeratotic nails bilateral  Plan of Care:  #1 Patient was evaluated. #2 Today nail biopsy was taken and sent to pathology for fungal culture. #3 Discussed different treatment options. #4 Patient is to return to clinic in 4 weeks to discuss fungal culture nail biopsy findings.   Felecia Shelling, DPM [facility]  Dr. Felecia Shelling, DPM    [address]                                        [place], [place] [address]                Office [phone]  Fax [phone]

## 2017-08-28 DIAGNOSIS — H4312 Vitreous hemorrhage, left eye: Secondary | ICD-10-CM | POA: Diagnosis not present

## 2017-08-28 DIAGNOSIS — H33312 Horseshoe tear of retina without detachment, left eye: Secondary | ICD-10-CM | POA: Diagnosis not present

## 2017-09-05 DIAGNOSIS — L609 Nail disorder, unspecified: Secondary | ICD-10-CM | POA: Diagnosis not present

## 2017-09-12 ENCOUNTER — Encounter: Payer: Self-pay | Admitting: Podiatry

## 2017-09-12 ENCOUNTER — Ambulatory Visit: Payer: BLUE CROSS/BLUE SHIELD | Admitting: Podiatry

## 2017-09-12 DIAGNOSIS — L603 Nail dystrophy: Secondary | ICD-10-CM

## 2017-09-14 NOTE — Progress Notes (Signed)
   Subjective: Patient presents today for follow up evaluation of fungal nails bilaterally 1 through 5. She states her symptoms have not changed since her previous visit. She is here to review the fungus testing results. Patient presents today for further treatment and evaluation.   Past Medical History:  Diagnosis Date  . Anemia    IDA  . HSV-2 (herpes simplex virus 2) infection   . Leucopenia   . STD (sexually transmitted disease)    pos gonorrhea  . Thyroid disease     Objective: Physical Exam General: The patient is alert and oriented x3 in no acute distress.  Dermatology: Hyperkeratotic, discolored, thickened, onychodystrophy of nails noted bilaterally.  Skin is warm, dry and supple bilateral lower extremities. Negative for open lesions or macerations.  Vascular: Palpable pedal pulses bilaterally. No edema or erythema noted. Capillary refill within normal limits.  Neurological: Epicritic and protective threshold grossly intact bilaterally.   Musculoskeletal Exam: Range of motion within normal limits to all pedal and ankle joints bilateral. Muscle strength 5/5 in all groups bilateral.   Assessment: #1 dystrophic nails bilaterally  Plan of Care:  #1 Patient was evaluated. #2 Nail biopsy reviewed. Negative for fungus.  #3 Recommended Urea 40% cream and shoe gear modifications.  #4 Return to clinic when necessary.    Felecia ShellingBrent M. Marili Vader, DPM [facility]  Dr. Felecia ShellingBrent M. Alverda Nazzaro, DPM    [address]                                        [place], [place] [id number]                Office [phone]  Fax [phone]

## 2017-09-29 ENCOUNTER — Ambulatory Visit: Payer: BLUE CROSS/BLUE SHIELD | Admitting: Adult Health

## 2017-09-29 VITALS — BP 124/82 | HR 78 | Temp 97.8°F | Ht 67.0 in | Wt 200.2 lb

## 2017-09-29 DIAGNOSIS — J40 Bronchitis, not specified as acute or chronic: Secondary | ICD-10-CM

## 2017-09-29 DIAGNOSIS — S90821A Blister (nonthermal), right foot, initial encounter: Secondary | ICD-10-CM

## 2017-09-29 DIAGNOSIS — R05 Cough: Secondary | ICD-10-CM

## 2017-09-29 DIAGNOSIS — R059 Cough, unspecified: Secondary | ICD-10-CM

## 2017-09-29 MED ORDER — AZITHROMYCIN 250 MG PO TABS
ORAL_TABLET | ORAL | 0 refills | Status: DC
Start: 2017-09-29 — End: 2017-10-06

## 2017-09-29 MED ORDER — BENZONATATE 200 MG PO CAPS: 200.0000 mg | ORAL_CAPSULE | Freq: Two times a day (BID) | ORAL | 0 refills | Status: DC | PRN

## 2017-09-29 MED ORDER — PREDNISONE 10 MG (21) PO TBPK: ORAL_TABLET | ORAL | 0 refills | Status: DC

## 2017-09-29 NOTE — Patient Instructions (Addendum)
Azithromycin tablets What is this medicine? AZITHROMYCIN (az ith roe MYE sin) is a macrolide antibiotic. It is used to treat or prevent certain kinds of bacterial infections. It will not work for colds, flu, or other viral infections. This medicine may be used for other purposes; ask your health care provider or pharmacist if you have questions. COMMON BRAND NAME(S): Zithromax, Zithromax Tri-Pak, Zithromax Z-Pak What should I tell my health care provider before I take this medicine? They need to know if you have any of these conditions: -kidney disease -liver disease -irregular heartbeat or heart disease -an unusual or allergic reaction to azithromycin, erythromycin, other macrolide antibiotics, foods, dyes, or preservatives -pregnant or trying to get pregnant -breast-feeding How should I use this medicine? Take this medicine by mouth with a full glass of water. Follow the directions on the prescription label. The tablets can be taken with food or on an empty stomach. If the medicine upsets your stomach, take it with food. Take your medicine at regular intervals. Do not take your medicine more often than directed. Take all of your medicine as directed even if you think your are better. Do not skip doses or stop your medicine early. Talk to your pediatrician regarding the use of this medicine in children. While this drug may be prescribed for children as young as 6 months for selected conditions, precautions do apply. Overdosage: If you think you have taken too much of this medicine contact a poison control center or emergency room at once. NOTE: This medicine is only for you. Do not share this medicine with others. What if I miss a dose? If you miss a dose, take it as soon as you can. If it is almost time for your next dose, take only that dose. Do not take double or extra doses. What may interact with this medicine? Do not take this medicine with any of the following  medications: -lincomycin This medicine may also interact with the following medications: -amiodarone -antacids -birth control pills -cyclosporine -digoxin -magnesium -nelfinavir -phenytoin -warfarin This list may not describe all possible interactions. Give your health care provider a list of all the medicines, herbs, non-prescription drugs, or dietary supplements you use. Also tell them if you smoke, drink alcohol, or use illegal drugs. Some items may interact with your medicine. What should I watch for while using this medicine? Tell your doctor or healthcare professional if your symptoms do not start to get better or if they get worse. Do not treat diarrhea with over the counter products. Contact your doctor if you have diarrhea that lasts more than 2 days or if it is severe and watery. This medicine can make you more sensitive to the sun. Keep out of the sun. If you cannot avoid being in the sun, wear protective clothing and use sunscreen. Do not use sun lamps or tanning beds/booths. What side effects may I notice from receiving this medicine? Side effects that you should report to your doctor or health care professional as soon as possible: -allergic reactions like skin rash, itching or hives, swelling of the face, lips, or tongue -confusion, nightmares or hallucinations -dark urine -difficulty breathing -hearing loss -irregular heartbeat or chest pain -pain or difficulty passing urine -redness, blistering, peeling or loosening of the skin, including inside the mouth -white patches or sores in the mouth -yellowing of the eyes or skin Side effects that usually do not require medical attention (report to your doctor or health care professional if they continue or are bothersome): -  diarrhea -dizziness, drowsiness -headache -stomach upset or vomiting -tooth discoloration -vaginal irritation This list may not describe all possible side effects. Call your doctor for medical advice  about side effects. You may report side effects to FDA at 1-800-FDA-1088. Where should I keep my medicine? Keep out of the reach of children. Store at room temperature between 15 and 30 degrees C (59 and 86 degrees F). Throw away any unused medicine after the expiration date. NOTE: This sheet is a summary. It may not cover all possible information. If you have questions about this medicine, talk to your doctor, pharmacist, or health care provider.  2018 Elsevier/Gold Standard (2015-12-15 15:26:03) Prednisone tablets What is this medicine? PREDNISONE (PRED ni sone) is a corticosteroid. It is commonly used to treat inflammation of the skin, joints, lungs, and other organs. Common conditions treated include asthma, allergies, and arthritis. It is also used for other conditions, such as blood disorders and diseases of the adrenal glands. This medicine may be used for other purposes; ask your health care provider or pharmacist if you have questions. COMMON BRAND NAME(S): Deltasone, Predone, Sterapred, Sterapred DS What should I tell my health care provider before I take this medicine? They need to know if you have any of these conditions: -Cushing's syndrome -diabetes -glaucoma -heart disease -high blood pressure -infection (especially a virus infection such as chickenpox, cold sores, or herpes) -kidney disease -liver disease -mental illness -myasthenia gravis -osteoporosis -seizures -stomach or intestine problems -thyroid disease -an unusual or allergic reaction to lactose, prednisone, other medicines, foods, dyes, or preservatives -pregnant or trying to get pregnant -breast-feeding How should I use this medicine? Take this medicine by mouth with a glass of water. Follow the directions on the prescription label. Take this medicine with food. If you are taking this medicine once a day, take it in the morning. Do not take more medicine than you are told to take. Do not suddenly stop taking  your medicine because you may develop a severe reaction. Your doctor will tell you how much medicine to take. If your doctor wants you to stop the medicine, the dose may be slowly lowered over time to avoid any side effects. Talk to your pediatrician regarding the use of this medicine in children. Special care may be needed. Overdosage: If you think you have taken too much of this medicine contact a poison control center or emergency room at once. NOTE: This medicine is only for you. Do not share this medicine with others. What if I miss a dose? If you miss a dose, take it as soon as you can. If it is almost time for your next dose, talk to your doctor or health care professional. You may need to miss a dose or take an extra dose. Do not take double or extra doses without advice. What may interact with this medicine? Do not take this medicine with any of the following medications: -metyrapone -mifepristone This medicine may also interact with the following medications: -aminoglutethimide -amphotericin B -aspirin and aspirin-like medicines -barbiturates -certain medicines for diabetes, like glipizide or glyburide -cholestyramine -cholinesterase inhibitors -cyclosporine -digoxin -diuretics -ephedrine -female hormones, like estrogens and birth control pills -isoniazid -ketoconazole -NSAIDS, medicines for pain and inflammation, like ibuprofen or naproxen -phenytoin -rifampin -toxoids -vaccines -warfarin This list may not describe all possible interactions. Give your health care provider a list of all the medicines, herbs, non-prescription drugs, or dietary supplements you use. Also tell them if you smoke, drink alcohol, or use illegal drugs. Some items   may interact with your medicine. What should I watch for while using this medicine? Visit your doctor or health care professional for regular checks on your progress. If you are taking this medicine over a prolonged period, carry an  identification card with your name and address, the type and dose of your medicine, and your doctor's name and address. This medicine may increase your risk of getting an infection. Tell your doctor or health care professional if you are around anyone with measles or chickenpox, or if you develop sores or blisters that do not heal properly. If you are going to have surgery, tell your doctor or health care professional that you have taken this medicine within the last twelve months. Ask your doctor or health care professional about your diet. You may need to lower the amount of salt you eat. This medicine may affect blood sugar levels. If you have diabetes, check with your doctor or health care professional before you change your diet or the dose of your diabetic medicine. What side effects may I notice from receiving this medicine? Side effects that you should report to your doctor or health care professional as soon as possible: -allergic reactions like skin rash, itching or hives, swelling of the face, lips, or tongue -changes in emotions or moods -changes in vision -depressed mood -eye pain -fever or chills, cough, sore throat, pain or difficulty passing urine -increased thirst -swelling of ankles, feet Side effects that usually do not require medical attention (report to your doctor or health care professional if they continue or are bothersome): -confusion, excitement, restlessness -headache -nausea, vomiting -skin problems, acne, thin and shiny skin -trouble sleeping -weight gain This list may not describe all possible side effects. Call your doctor for medical advice about side effects. You may report side effects to FDA at 1-800-FDA-1088. Where should I keep my medicine? Keep out of the reach of children. Store at room temperature between 15 and 30 degrees C (59 and 86 degrees F). Protect from light. Keep container tightly closed. Throw away any unused medicine after the expiration  date. NOTE: This sheet is a summary. It may not cover all possible information. If you have questions about this medicine, talk to your doctor, pharmacist, or health care provider.  2018 Elsevier/Gold Standard (2011-06-02 10:57:14) Pseudoephedrine tablets What is this medicine? PSEUDOEPHEDRINE (soo doe e FED rin) is a decongestant. It is used to treat congestion of the nose or sinuses. This medicine may be used for other purposes; ask your health care provider or pharmacist if you have questions. COMMON BRAND NAME(S): Contac Cold 12 Hour, Genaphed, NASAL Decongestant, Nexafed, Pseudo-Time, Sudafed, Sudafed Congestion, Sudogest, Zephrex-D What should I tell my health care provider before I take this medicine? They need to know if you have any of the following conditions: -diabetes -glaucoma -heart disease -high blood pressure -kidney disease -prostate trouble -taken an MAOI like Carbex, Eldepryl, Marplan, Nardil, or Parnate in last 14 days -thyroid disease -trouble passing urine -an unusual or allergic reaction to pseudoephedrine, other medicines, foods, dyes, or preservatives -pregnant or trying to get pregnant -breast-feeding How should I use this medicine? Take this medicine by mouth with a glass of water. Follow the directions on the package or prescription label. Take your medicine at regular intervals. Do not take your medicine more often than directed. Talk to your pediatrician regarding the use of this medicine in children. While this drug may be prescribed for children as young as 596 years of age for selected conditions,  precautions do apply. Patients over 32 years old may have a stronger reaction and need a smaller dose. Overdosage: If you think you have taken too much of this medicine contact a poison control center or emergency room at once. NOTE: This medicine is only for you. Do not share this medicine with others. What if I miss a dose? If you miss a dose, take it as soon  as you can. If it is almost time for your next dose, take only that dose. Do not take double or extra doses. What may interact with this medicine? Do not take this medicine with any of the following medications: -bromocriptine -ergot alkaloids like dihydroergotamine, ergonovine, ergotamine, methylergonovine -MAOIs like Carbex, Eldepryl, Marplan, Nardil, and Parnate -stimulant medicines for attention disorders, weight loss, or to stay awake This medicine may also interact with the following medications: -alcohol -atropine -bretylium -caffeine -digoxin -linezolid -mecamylamine -medicines for blood pressure -medicines for depression, anxiety, or psychotic disturbances like fluoxetine, sertraline -medicines for enlarged prostate -medicines for sleep -other medicines for cold, cough, or allergy -procarbazine -reserpine -some heart medicines like metoprolol -St. John's Wort This list may not describe all possible interactions. Give your health care provider a list of all the medicines, herbs, non-prescription drugs, or dietary supplements you use. Also tell them if you smoke, drink alcohol, or use illegal drugs. Some items may interact with your medicine. What should I watch for while using this medicine? Tell your doctor or healthcare professional if your symptoms do not start to get better or if they get worse. See your doctor if you are not better in 7 days or if you have a fever. What side effects may I notice from receiving this medicine? Side effects that you should report to your doctor or health care professional as soon as possible: -allergic reactions like skin rash, itching or hives, swelling of the face, lips, or tongue -bloody diarrhea with stomach pain -breathing problems -chest pain -confused, agitated, nervous -fast, irregular heartbeat -feeling faint or lightheaded, falls -hallucinations -high blood pressure -pain, tingling, numbness in the hands or feet -trouble  passing urine or change in the amount of urine -trouble sleeping Side effects that usually do not require medical attention (report to your doctor or health care professional if they continue or are bothersome): -headache -loss of appetite -nausea, stomach upset This list may not describe all possible side effects. Call your doctor for medical advice about side effects. You may report side effects to FDA at 1-800-FDA-1088. Where should I keep my medicine? Keep out of the reach of children. This medicine may cause accidental overdose and death if taken by other adults, children, or pets. Mix any unused medicine with a substance like cat littler or coffee grounds. Then throw the medicine away in a sealed container like a sealed bag or a coffee can with a lid. Do not use the medicine after the expiration date. Store at room temperature between 15 and 25 degrees C (59 and 77 degrees F). Protect from heat and moisture. NOTE: This sheet is a summary. It may not cover all possible information. If you have questions about this medicine, talk to your doctor, pharmacist, or health care provider.  2018 Elsevier/Gold Standard (2014-06-21 19:28:08) Acute Bronchitis, Adult Acute bronchitis is when air tubes (bronchi) in the lungs suddenly get swollen. The condition can make it hard to breathe. It can also cause these symptoms:  A cough.  Coughing up clear, yellow, or green mucus.  Wheezing.  Chest congestion.  Shortness of breath.  A fever.  Body aches.  Chills.  A sore throat.  Follow these instructions at home: Medicines  Take over-the-counter and prescription medicines only as told by your doctor.  If you were prescribed an antibiotic medicine, take it as told by your doctor. Do not stop taking the antibiotic even if you start to feel better. General instructions  Rest.  Drink enough fluids to keep your pee (urine) clear or pale yellow.  Avoid smoking and secondhand smoke. If you  smoke and you need help quitting, ask your doctor. Quitting will help your lungs heal faster.  Use an inhaler, cool mist vaporizer, or humidifier as told by your doctor.  Keep all follow-up visits as told by your doctor. This is important. How is this prevented? To lower your risk of getting this condition again:  Wash your hands often with soap and water. If you cannot use soap and water, use hand sanitizer.  Avoid contact with people who have cold symptoms.  Try not to touch your hands to your mouth, nose, or eyes.  Make sure to get the flu shot every year.  Contact a doctor if:  Your symptoms do not get better in 2 weeks. Get help right away if:  You cough up blood.  You have chest pain.  You have very bad shortness of breath.  You become dehydrated.  You faint (pass out) or keep feeling like you are going to pass out.  You keep throwing up (vomiting).  You have a very bad headache.  Your fever or chills gets worse. This information is not intended to replace advice given to you by your health care provider. Make sure you discuss any questions you have with your health care provider. Document Released: 04/04/2008 Document Revised: 05/25/2016 Document Reviewed: 04/06/2016 Elsevier Interactive Patient Education  2017 [employer] Inc. Cough, Adult A cough helps to clear your throat and lungs. A cough may last only 2-3 weeks (acute), or it may last longer than 8 weeks (chronic). Many different things can cause a cough. A cough may be a sign of an illness or another medical condition. Follow these instructions at home:  Pay attention to any changes in your cough.  Take medicines only as told by your doctor. ? If you were prescribed an antibiotic medicine, take it as told by your doctor. Do not stop taking it even if you start to feel better. ? Talk with your doctor before you try using a cough medicine.  Drink enough fluid to keep your pee (urine) clear or pale  yellow.  If the air is dry, use a cold steam vaporizer or humidifier in your home.  Stay away from things that make you cough at work or at home.  If your cough is worse at night, try using extra pillows to raise your head up higher while you sleep.  Do not smoke, and try not to be around smoke. If you need help quitting, ask your doctor.  Do not have caffeine.  Do not drink alcohol.  Rest as needed. Contact a doctor if:  You have new problems (symptoms).  You cough up yellow fluid (pus).  Your cough does not get better after 2-3 weeks, or your cough gets worse.  Medicine does not help your cough and you are not sleeping well.  You have pain that gets worse or pain that is not helped with medicine.  You have a fever.  You are losing weight and you do not  know why.  You have night sweats. Get help right away if:  You cough up blood.  You have trouble breathing.  Your heartbeat is very fast. This information is not intended to replace advice given to you by your health care provider. Make sure you discuss any questions you have with your health care provider. Document Released: 06/30/2011 Document Revised: 03/24/2016 Document Reviewed: 12/24/2014 Elsevier Interactive Patient Education  2018 [employer]  Blisters, Adult A blister is a raised bubble of skin filled with liquid. Blisters often develop in an area of the skin that repeatedly rubs or presses against another surface (friction blister). Friction blisters can occur on any part of the body, but they usually develop on the hands or feet. Long-term pressure on the same area of the skin can also lead to areas of hardened skin (calluses). What are the causes? A blister can be caused by:  An injury.  A burn.  An allergic reaction.  An infection.  Exposure to irritating chemicals.  Friction, especially in an area with a lot of heat and moisture.  Friction blisters often result  from:  Sports.  Repetitive activities.  Using tools and doing other activities without wearing gloves.  Shoes that are too tight or too loose.  What are the signs or symptoms? A blister is often round and looks like a bump. It may:  Itch.  Be painful to the touch.  Before a blister forms, the skin may:  Become red.  Feel warm.  Itch.  Be painful to the touch.  How is this diagnosed? A blister is diagnosed with a physical exam. How is this treated? Treatment usually involves protecting the area where the blister has formed until the skin has healed. Other treatments may include:  A bandage (dressing) to cover the blister.  Extra padding around and over the blister, so that it does not rub on anything.  Antibiotic ointment.  Most blisters break open, dry up, and go away on their own within 1-2 weeks. Blisters that are very painful may be drained before they break open on their own. If the blister is large or painful, it can be drained by: 1. Sterilizing a small needle with rubbing alcohol. 2. Washing your hands with soap and water. 3. Inserting the needle in the edge of the blister to make a small hole. Some fluid will drain out of the hole. Let the top or roof of the blister stay in place. This helps the skin heal. 4. Washing the blister with mild soap and water. 5. Covering the blister with antibiotic ointment, if prescribed by your health care provider, and a dressing.  Some blisters may need to be drained by a health care provider. Follow these instructions at home:  Protect the area where the blister has formed as told by your health care provider.  Keep your blister clean and dry. This helps to prevent infection.  Do not pop your blister. This can cause infection.  If you were prescribed an antibiotic, use it as told by your health care provider. Do not stop using the antibiotic even if your condition improves.  Wear different shoes until the blister  heals.  Avoid the activity that caused the blister until your blister heals.  Check your blister every day for signs of infection. Check for: ? More redness, swelling, or pain. ? More fluid or blood. ? Warmth. ? Pus or a bad smell. ? The blister getting better and then getting worse. How is this  prevented? Taking these steps can help to prevent blisters that are caused by friction. Make sure you:  Wear comfortable shoes that fit well.  Always wear socks with shoes.  Wear extra socks or use tape, bandages, or pads over blister-prone areas as needed. You may also apply petroleum jelly under bandages in blister-prone areas.  Wear protective gear, such as gloves, when participating in sports or activities that can cause blisters.  Wear loose-fitting, moisture-wicking clothes when participating in sports or activities.  Use powders as needed to keep your feet dry.  Contact a health care provider if:  You have more redness, swelling, or pain around your blister.  You have more fluid or blood coming from your blister.  Your blister feels warm to the touch.  You have pus or a bad smell coming from your blister.  You have a fever or chills.  Your blister gets better and then it gets worse. This information is not intended to replace advice given to you by your health care provider. Make sure you discuss any questions you have with your health care provider. Document Released: 11/24/2004 Document Revised: 06/15/2016 Document Reviewed: 04/29/2016 Elsevier Interactive Patient Education  2018 [employer].

## 2017-09-29 NOTE — Progress Notes (Addendum)
Subjective:     Patient ID: Carol Davis, female   DOB: 10/09/1956, 61 y.o.   MRN: [id number]  HPI   Patient is a 61 year old female in no acute distress who presents to the office with a history of ten day cough. She reports she is feeling better than she was when this started.   She reports on Tuesday she noticed increased chest congestion though this has improved some today. She has been using LawyerTessalon Perles at night.  She does reports mild lower back soreness with coughing.  Patient  denies any fever, chills, rash, chest pain, shortness of breath, nausea, vomiting, or diarrhea. Shdenies any recent inllness and no one else sis sick in her home.   Quit smoking 30 years ago.   Blood pressure 124/82, pulse 78, temperature 97.8 F (36.6 C), temperature source Tympanic, height 5\' 7"  (1.702 m), weight 200 lb 3.2 oz (90.8 kg), SpO2 97 %.  Patient Active Problem List   Diagnosis Date Noted  . Leukopenia 03/16/2015  . Iron deficiency anemia 03/16/2015  . B12 deficiency 03/16/2015   Current Outpatient Medications on File Prior to Visit  Medication Sig Dispense Refill  . Cholecalciferol (VITAMIN D-1000 MAX ST) 1000 UNITS tablet Take by mouth.    . DiphenhydrAMINE HCl (BENADRYL ALLERGY PO) Take by mouth.    . levothyroxine (LEVOXYL) 100 MCG tablet Take 100 mcg by mouth once.    . nabumetone (RELAFEN) 750 MG tablet Take by mouth.    . nystatin cream (MYCOSTATIN) Apply 1 application topically 2 (two) times daily. Between toes for rash 30 g 0  . valACYclovir (VALTREX) 500 MG tablet Take 1 tablet (500 mg total) by mouth 2 (two) times daily. TAKE ONE TABLET TWICE A DAY WITH FOOD (Patient not taking: Reported on 07/07/2017) 10 tablet 3   No current facility-administered medications on file prior to visit.        She also has a area on her right heel she is concerned about that has been present for two days.    Review of Systems  Constitutional: Negative for activity change, appetite  change, chills, diaphoresis, fatigue, fever and unexpected weight change.  HENT: Positive for congestion, postnasal drip, rhinorrhea and voice change (hoarse in the morning ). Negative for drooling, sinus pressure, sinus pain, sore throat and trouble swallowing.   Eyes: Negative.   Respiratory: Positive for cough (lower back painful last two days with cough). Negative for apnea, choking, chest tightness, shortness of breath, wheezing and stridor.   Cardiovascular: Negative.   Gastrointestinal: Negative.        She sees for ulcerative colitis and will follow up with specialist as needs.   Endocrine: Negative.   Genitourinary: Negative.   Musculoskeletal: Negative.  Negative for back pain (mild lower back " soreness" bilateraly with coughing. ).  Skin: Negative.   Allergic/Immunologic: Negative.   Neurological: Negative.   Hematological: Negative.   Psychiatric/Behavioral: Negative.        Objective:   Physical Exam  Constitutional: She is oriented to person, place, and time. She appears well-developed and well-nourished. No distress.  HENT:  Head: Normocephalic and atraumatic.  Right Ear: Hearing, external ear and ear canal normal. A middle ear effusion is present.  Left Ear: Hearing, external ear and ear canal normal. A middle ear effusion is present.  Nose: Mucosal edema and rhinorrhea present. Right sinus exhibits no maxillary sinus tenderness and no frontal sinus tenderness. Left sinus exhibits no maxillary sinus tenderness and no  frontal sinus tenderness.  Mouth/Throat: Uvula is midline and mucous membranes are normal. No uvula swelling. Posterior oropharyngeal erythema (mild ) present.  Eyes: Conjunctivae and EOM are normal. Pupils are equal, round, and reactive to light. Right eye exhibits no discharge. Left eye exhibits no discharge. No scleral icterus.  Neck: Normal range of motion. Neck supple. No JVD present. No tracheal deviation present. No thyromegaly present.   Cardiovascular: Normal rate, regular rhythm, normal heart sounds and intact distal pulses. Exam reveals no gallop and no friction rub.  No murmur heard. Pulmonary/Chest: Effort normal and breath sounds normal. No accessory muscle usage or stridor. No respiratory distress. She has no decreased breath sounds. She has no wheezes. She has no rhonchi. She has no rales. She exhibits no tenderness.  Mild bronchial congestion auscultated.   Abdominal: Soft. Bowel sounds are normal. She exhibits no distension. There is no tenderness.  Musculoskeletal: Normal range of motion. She exhibits no edema, tenderness or deformity.  Lymphadenopathy:    She has no cervical adenopathy.  Neurological: She is alert and oriented to person, place, and time. She has normal reflexes. She displays normal reflexes. No cranial nerve deficit. She exhibits normal muscle tone. Coordination normal.  Skin: Skin is warm and dry. No rash noted. She is not diaphoretic. No erythema. No pallor.  Blister on right heel appears to have popped and soft skin present. Approximately 0.5cm x 0.5cm. No drainage, warmth, or any other areas present. No erythema.   Psychiatric: She has a normal mood and affect. Her behavior is normal. Judgment and thought content normal.  Nursing note and vitals reviewed.      Assessment:     Cough - Plan: CBC w/Diff  Bronchitis  Blister of right foot, initial encounter - heel       Plan:     Meds ordered this encounter  Medications  . azithromycin (ZITHROMAX) 250 MG tablet    Sig: Take 2 tablets today and one tablet on days 2 ,3 , 4, 5    Dispense:  6 tablet    Refill:  0  .     . benzonatate (TESSALON) 200 MG capsule    Sig: Take 1 capsule (200 mg total) by mouth 2 (two) times daily as needed for cough. May cause drowsiness do not drive    Dispense:  20 capsule    Refill:  0  . predniSONE (STERAPRED UNI-PAK 21 TAB) 10 MG (21) TBPK tablet    Sig: Take 6 tablets on day one, 5 tablets on day  two, 4 tablets on day three, 3 tablets on day four, and 2 tablets on day five.    Dispense:  21 tablet    Refill:  0    Orders Placed This Encounter  Procedures  . CBC w/Diff   Provider sees history of neutropenia and no recheck seen will check CBC as precaution- she reports she was cleared from this and it was found to be " nothing".  Nasal saline per package instructions.  Flonase may continue.   Advised to return to clinic for an appointment if no improvement within 72 hours or if any symptoms change or worsen. Advised ER or urgent Care if after hours or on weekend. 911 for emergency symptoms at any time.  Patient verbalized understanding of all instructions given and denies any further questions at this time.

## 2017-09-30 LAB — CBC WITH DIFFERENTIAL/PLATELET
BASOS ABS: 0 10*3/uL (ref 0.0–0.2)
BASOS: 0 %
EOS (ABSOLUTE): 0.3 10*3/uL (ref 0.0–0.4)
Eos: 5 %
HEMOGLOBIN: 12.3 g/dL (ref 11.1–15.9)
Hematocrit: 37.9 % (ref 34.0–46.6)
Immature Grans (Abs): 0 10*3/uL (ref 0.0–0.1)
Immature Granulocytes: 0 %
LYMPHS ABS: 1.4 10*3/uL (ref 0.7–3.1)
Lymphs: 27 %
MCH: 30.1 pg (ref 26.6–33.0)
MCHC: 32.5 g/dL (ref 31.5–35.7)
MCV: 93 fL (ref 79–97)
Monocytes Absolute: 0.4 10*3/uL (ref 0.1–0.9)
Monocytes: 8 %
NEUTROS ABS: 3.2 10*3/uL (ref 1.4–7.0)
Neutrophils: 60 %
Platelets: 283 10*3/uL (ref 150–379)
RBC: 4.08 x10E6/uL (ref 3.77–5.28)
RDW: 13.2 % (ref 12.3–15.4)
WBC: 5.3 10*3/uL (ref 3.4–10.8)

## 2017-10-05 ENCOUNTER — Other Ambulatory Visit: Payer: Self-pay

## 2017-10-05 ENCOUNTER — Emergency Department
Admission: EM | Admit: 2017-10-05 | Discharge: 2017-10-05 | Disposition: A | Discharge status: Home / Self Care | Payer: BLUE CROSS/BLUE SHIELD | Attending: Emergency Medicine | Admitting: Emergency Medicine

## 2017-10-05 DIAGNOSIS — Z79899 Other long term (current) drug therapy: Secondary | ICD-10-CM | POA: Diagnosis not present

## 2017-10-05 DIAGNOSIS — K625 Hemorrhage of anus and rectum: Secondary | ICD-10-CM | POA: Insufficient documentation

## 2017-10-05 DIAGNOSIS — K602 Anal fissure, unspecified: Secondary | ICD-10-CM

## 2017-10-05 LAB — COMPREHENSIVE METABOLIC PANEL
ALK PHOS: 52 U/L (ref 38–126)
ALT: 14 U/L (ref 14–54)
ANION GAP: 9 (ref 5–15)
AST: 17 U/L (ref 15–41)
Albumin: 3.9 g/dL (ref 3.5–5.0)
BILIRUBIN TOTAL: 0.5 mg/dL (ref 0.3–1.2)
BUN: 22 mg/dL — ABNORMAL HIGH (ref 6–20)
CO2: 27 mmol/L (ref 22–32)
Calcium: 8.9 mg/dL (ref 8.9–10.3)
Chloride: 99 mmol/L — ABNORMAL LOW (ref 101–111)
Creatinine, Ser: 0.59 mg/dL (ref 0.44–1.00)
Glucose, Bld: 97 mg/dL (ref 65–99)
Potassium: 4.2 mmol/L (ref 3.5–5.1)
Sodium: 135 mmol/L (ref 135–145)
TOTAL PROTEIN: 7.1 g/dL (ref 6.5–8.1)

## 2017-10-05 LAB — CBC
HCT: 37.8 % (ref 35.0–47.0)
HEMOGLOBIN: 13.1 g/dL (ref 12.0–16.0)
MCH: 31.7 pg (ref 26.0–34.0)
MCHC: 34.6 g/dL (ref 32.0–36.0)
MCV: 91.6 fL (ref 80.0–100.0)
Platelets: 292 10*3/uL (ref 150–440)
RBC: 4.12 MIL/uL (ref 3.80–5.20)
RDW: 13.1 % (ref 11.5–14.5)
WBC: 6.6 10*3/uL (ref 3.6–11.0)

## 2017-10-05 MED ORDER — SENNOSIDES-DOCUSATE SODIUM 8.6-50 MG PO TABS: 2.0000 | ORAL_TABLET | Freq: Two times a day (BID) | ORAL | 0 refills | Status: DC

## 2017-10-05 MED ORDER — TUCKS 50 % EX PADS: 1.0000 "application " | MEDICATED_PAD | CUTANEOUS | 2 refills | Status: DC | PRN

## 2017-10-05 NOTE — ED Triage Notes (Signed)
Pt c/o rectal bleeding, bright red blood with regular stools for the past 7 days, states she has a Hx of anal tear in the past  That had to be repaired,

## 2017-10-05 NOTE — ED Provider Notes (Signed)
[facility] Emergency Department Provider Note  ____________________________________________  Time seen: Approximately 8:09 PM  I have reviewed the triage vital signs and the nursing notes.   HISTORY  Chief Complaint Rectal Bleeding    HPI Carol Davis is a 61 y.o. female who complains of rectal bleeding for the past 7 days. She has a history of ulcerative colitis as well as a anal tear that had to be surgically repaired. Denies any trauma. She did have some hard bowel movements at the start of this because [place] was traveling [place] from [place]. She tried taking antibiotics and prednisone for a week without any change in her symptoms. Denies any abdominal pain. No fevers chills or sweats. No aggravating or alleviating factors. She only notices fresh red blood when she has a bowel movement. No bleeding in between. Mild to moderate severity by the patient's estimation. Intermittent.     Past Medical History:  Diagnosis Date  . Anemia    IDA  . HSV-2 (herpes simplex virus 2) infection   . Leucopenia   . STD (sexually transmitted disease)    pos gonorrhea  . Thyroid disease      Patient Active Problem List   Diagnosis Date Noted  . Leukopenia 03/16/2015  . Iron deficiency anemia 03/16/2015  . B12 deficiency 03/16/2015     Past Surgical History:  Procedure Laterality Date  . AUGMENTATION MAMMAPLASTY  10/2016   removed   . TOTAL HIP ARTHROPLASTY Left      Prior to Admission medications   Medication Sig Start Date End Date Taking? Authorizing Provider  azithromycin (ZITHROMAX) 250 MG tablet Take 2 tablets today and one tablet on days 2 ,3 , 4, 5 09/29/17   Flinchum, Eula FriedMichelle S, FNP  benzonatate (TESSALON) 200 MG capsule Take 1 capsule (200 mg total) by mouth 2 (two) times daily as needed for cough. May cause drowsiness do not drive 91/47/8211/30/18   Flinchum, Eula FriedMichelle S, FNP  Cholecalciferol (VITAMIN D-1000 MAX ST) 1000 UNITS tablet Take by mouth.     [provider]  DiphenhydrAMINE HCl (BENADRYL ALLERGY PO) Take by mouth.    [provider]  levothyroxine (LEVOXYL) 100 MCG tablet Take 100 mcg by mouth once. 06/20/16   [provider]  nabumetone (RELAFEN) 750 MG tablet Take by mouth.    [provider]  nystatin cream (MYCOSTATIN) Apply 1 application topically 2 (two) times daily. Between toes for rash 07/07/17   Flinchum, Eula FriedMichelle S, FNP  predniSONE (STERAPRED UNI-PAK 21 TAB) 10 MG (21) TBPK tablet Take 6 tablets on day one, 5 tablets on day two, 4 tablets on day three, 3 tablets on day four, and 2 tablets on day five. 09/29/17   Flinchum, Eula FriedMichelle S, FNP  senna-docusate (SENOKOT-S) 8.6-50 MG tablet Take 2 tablets by mouth 2 (two) times daily. 10/05/17   Sharman CheekStafford, Kaimana Neuzil, MD  valACYclovir (VALTREX) 500 MG tablet Take 1 tablet (500 mg total) by mouth 2 (two) times daily. TAKE ONE TABLET TWICE A DAY WITH FOOD 06/21/17   Defrancesco, Prentice DockerMartin A, MD  Witch Hazel (TUCKS) 50 % PADS Apply 1 application topically every 2 (two) hours as needed. 10/05/17   Sharman CheekStafford, Anaria Kroner, MD     Allergies Patient has no known allergies.   Family History  Problem Relation Age of Onset  . Diabetes Mother   . Diabetes Father   . Diabetes Brother   . Breast cancer Neg Hx   . Ovarian cancer Neg Hx   . Colon  cancer Neg Hx   . Heart disease Neg Hx     Social History Social History   Tobacco Use  . Smoking status: Never Smoker  . Smokeless tobacco: Never Used  . Tobacco comment: quit 30 years ago  Substance Use Topics  . Alcohol use: No  . Drug use: No    Review of Systems  Constitutional:   No fever or chills.  ENT:  Positive nasal congestion Cardiovascular:   No chest pain or syncope. Respiratory:   No dyspnea or cough. Gastrointestinal:   Negative for abdominal pain, vomiting and diarrhea. Positive rectal bleeding as above Musculoskeletal:   Negative for focal pain or swelling All other systems reviewed and are  negative except as documented above in ROS and HPI.  ____________________________________________   PHYSICAL EXAM:  VITAL SIGNS: ED Triage Vitals [10/05/17 1808]  Enc Vitals Group     BP 124/84     Pulse Rate 78     Resp 16     Temp 98.5 F (36.9 C)     Temp Source Oral     SpO2 98 %     Weight 195 lb (88.5 kg)     Height 5\' 7"  (1.702 m)     Head Circumference      Peak Flow      Pain Score      Pain Loc      Pain Edu?      Excl. in GC?     Vital signs reviewed, nursing assessments reviewed.   Constitutional:   Alert and oriented. Well appearing and in no distress. Eyes:   No scleral icterus.  EOMI. ENT   Head:   Normocephalic and atraumatic.   Nose:   No congestion/rhinnorhea. No epistaxis    Neck:   No meningismus. Full ROM. Hematological/Lymphatic/Immunilogical:   No cervical lymphadenopathy. Cardiovascular:   RRR. Symmetric bilateral radial and DP pulses.  No murmurs.  Respiratory:   Normal respiratory effort without tachypnea/retractions. Breath sounds are clear and equal bilaterally. No wheezes/rales/rhonchi. Gastrointestinal:   Soft and nontender. Non distended. There is no CVA tenderness.  No rebound, rigidity, or guarding. Rectal exam performed with nurse at bedside, there is an anal fissure. No hemorrhoids. Digital exam shows no tenderness, but there is a scant amount of blood-tinged secretions in the vault, Hemoccult positive, controls okay Genitourinary:   deferred Musculoskeletal:   Normal range of motion in all extremities. No joint effusions.  No lower extremity tenderness.  No edema. Neurologic:   Normal speech and language.  Motor grossly intact. No gross focal neurologic deficits are appreciated.    ____________________________________________    LABS (pertinent positives/negatives) (all labs ordered are listed, but only abnormal results are displayed) Labs Reviewed  COMPREHENSIVE METABOLIC PANEL - Abnormal; Notable for the following  components:      Result Value   Chloride 99 (*)    BUN 22 (*)    All other components within normal limits  CBC   ____________________________________________   EKG    ____________________________________________    RADIOLOGY  No results found.  ____________________________________________   PROCEDURES Procedures  ____________________________________________     CLINICAL IMPRESSION / ASSESSMENT AND PLAN / ED COURSE  Pertinent labs & imaging results that were available during my care of the patient were reviewed by me and considered in my medical decision making (see chart for details).   Patient well-appearing no acute distress, normal vital signs. Abdomen is benign, unlikely ulcerative colitis flare. No imaging needed at this  time. Appears to be due to anal fissure. Treated with stool softener, Tucks, sitz baths. Follow up with surgery as needed.Considering the patient's symptoms, medical history, and physical examination today, I have low suspicion for cholecystitis or biliary pathology, pancreatitis, perforation or bowel obstruction, hernia, intra-abdominal abscess, AAA or dissection, volvulus or intussusception, mesenteric ischemia, or appendicitis.        ____________________________________________   FINAL CLINICAL IMPRESSION(S) / ED DIAGNOSES    Final diagnoses:  Rectal bleeding  Anal fissure      This SmartLink is deprecated. Use AVSMEDLIST instead to display the medication list for a patient.   Portions of this note were generated with dragon dictation software. Dictation errors may occur despite best attempts at proofreading.    Sharman Cheek, MD 10/05/17 2022

## 2017-10-06 ENCOUNTER — Ambulatory Visit: Payer: BLUE CROSS/BLUE SHIELD | Admitting: Adult Health

## 2017-10-06 ENCOUNTER — Ambulatory Visit
Admission: RE | Admit: 2017-10-06 | Discharge: 2017-10-06 | Disposition: A | Discharge status: Home / Self Care | Payer: BLUE CROSS/BLUE SHIELD | Source: Ambulatory Visit | Attending: Adult Health | Admitting: Adult Health

## 2017-10-06 ENCOUNTER — Telehealth: Payer: Self-pay

## 2017-10-06 ENCOUNTER — Encounter: Payer: Self-pay | Admitting: Adult Health

## 2017-10-06 VITALS — BP 110/72 | HR 96 | Temp 98.0°F | Resp 16 | Ht 67.0 in | Wt 199.0 lb

## 2017-10-06 DIAGNOSIS — R05 Cough: Secondary | ICD-10-CM | POA: Diagnosis not present

## 2017-10-06 DIAGNOSIS — R059 Cough, unspecified: Secondary | ICD-10-CM

## 2017-10-06 NOTE — Progress Notes (Signed)
Subjective:     Patient ID: Carol Davis, female   DOB: 03/01/1956, 61 y.o.   MRN: [id number]  HPI   Patient is a 61 year old female in no acute stress for follow up she has cough and tickle in throat constantly. She reports she is now able to cough up mucous as she was not before. She reports yellow and clear  tinged mucous. She feels tickle in throat constantly. She denies any pain with inspiration.  She reports  cough is sporatic and coughing at night.  She is still using Tessalon Perles with relief at night.  . Patient  denies any fever, chills, rash, chest pain, shortness of breath, nausea, vomiting, or diarrhea.  Patient  denies any fever, chills, rash, chest pain, shortness of breath, nausea, vomiting, or diarrhea.   Provider reviewed chart and  Saw ER viist yesterday for anal fissure at ER yesterday. She is doing sitz bath and stool softeners.  She was advised  Follow up with Ricarda FrameWoodham, Charles, MD (General Surgery) in 1 week (10/12/2017. Patient reports she is not at this office for this issue and will follow up as she was instructed.  BUN was also noted to be mildly elevated. CBC was normal in ER on 10/05/17      Patient Active Problem List   Diagnosis Date Noted  . Leukopenia 03/16/2015  . Iron deficiency anemia 03/16/2015  . B12 deficiency 03/16/2015     Current Outpatient Medications:  .  benzonatate (TESSALON) 200 MG capsule, Take 1 capsule (200 mg total) by mouth 2 (two) times daily as needed for cough. May cause drowsiness do not drive, Disp: 20 capsule, Rfl: 0 .  levothyroxine (LEVOXYL) 100 MCG tablet, Take 100 mcg by mouth once., Disp: , Rfl:  .  Cholecalciferol (VITAMIN D-1000 MAX ST) 1000 UNITS tablet, Take by mouth., Disp: , Rfl:  .  DiphenhydrAMINE HCl (BENADRYL ALLERGY PO), Take by mouth., Disp: , Rfl:  .  nabumetone (RELAFEN) 750 MG tablet, Take by mouth., Disp: , Rfl:  .  predniSONE (STERAPRED UNI-PAK 21 TAB) 10 MG (21) TBPK tablet, Take 6 tablets on day  one, 5 tablets on day two, 4 tablets on day three, 3 tablets on day four, and 2 tablets on day five. (Patient not taking: Reported on 10/06/2017), Disp: 21 tablet, Rfl: 0  Review of Systems  Constitutional: Negative.   HENT: Positive for congestion, postnasal drip and rhinorrhea. Negative for ear discharge, ear pain, sinus pressure, sinus pain, sneezing, sore throat, tinnitus and trouble swallowing.   Eyes: Negative.   Respiratory: Positive for cough.   Cardiovascular: Negative.   Gastrointestinal: Positive for anal bleeding (yes patient was seen in ED on 10/06/17 and see ED note/ she reports no change since being seen in the ER . She has a follow up in one week ).  Genitourinary: Negative.   Skin: Negative.   Neurological: Negative.   Psychiatric/Behavioral: Negative.        Objective:   Physical Exam  Constitutional: She appears well-developed and well-nourished. No distress.  HENT:  Head: Normocephalic and atraumatic.  Right Ear: Hearing, external ear and ear canal normal. A middle ear effusion is present.  Left Ear: Hearing, external ear and ear canal normal. A middle ear effusion is present.  Nose: Mucosal edema and rhinorrhea present. Right sinus exhibits no maxillary sinus tenderness and no frontal sinus tenderness. Left sinus exhibits no maxillary sinus tenderness and no frontal sinus tenderness.  Mouth/Throat: Uvula is midline and mucous  membranes are normal. No uvula swelling. Posterior oropharyngeal erythema present. No oropharyngeal exudate, posterior oropharyngeal edema or tonsillar abscesses.  Eyes: Conjunctivae and EOM are normal. Pupils are equal, round, and reactive to light. Right eye exhibits no discharge. Left eye exhibits no discharge. No scleral icterus.  Neck: Normal range of motion. Neck supple.  Cardiovascular: Normal rate, regular rhythm, normal heart sounds and intact distal pulses.  Pulmonary/Chest: Effort normal and breath sounds normal. No respiratory distress.  She has no wheezes. She has no rales. She exhibits no tenderness.  Skin: She is not diaphoretic.  Vitals reviewed.      Assessment:     Cough - Plan: DG Chest 2 View      Plan:     Continue Mucinex, Increase oral fluids while taking Mucinex.  Will do chest x-ray with cough x 3 weeks.   Orders Placed This Encounter  Procedures  . DG Chest 2 View    Standing Status:   Future    Number of Occurrences:   1    Standing Expiration Date:   11/06/2017    Order Specific Question:   Reason for Exam (SYMPTOM  OR DIAGNOSIS REQUIRED)    Answer:   cough x 3 weeks.    Order Specific Question:   Is patient pregnant?    Answer:   No    Order Specific Question:   Preferred imaging location?    Answer:   [facility]    Order Specific Question:   Call Results- Best Contact Number?    Answer:   [phone]    Order Specific Question:   Radiology Contrast Protocol - do NOT remove file path    Answer:   [email or web address]   She is advised to follow up with her PCP within one week as well an to follow up; with her gastrointestinal MD and surgeon as she has been advised by previous healthcare provider.  Advised to return to clinic for an appointment if no improvement within 72 hours or if any symptoms change or worsen. Advised ER or urgent Care if after hours or on weekend. 911 for emergency symptoms at any time.   Patient verbalized understanding of all instructions given and denies any further questions at this time.

## 2017-10-06 NOTE — Telephone Encounter (Signed)
Patient notified that her Chest X-Ray was normal.  She is to continue her current plan and follow up in the office next week. Patient verbalizes understanding.

## 2017-10-09 NOTE — Patient Instructions (Signed)

## 2017-10-11 ENCOUNTER — Ambulatory Visit: Payer: BLUE CROSS/BLUE SHIELD | Admitting: Adult Health

## 2017-10-11 ENCOUNTER — Telehealth: Payer: Self-pay | Admitting: Surgery

## 2017-10-11 NOTE — Telephone Encounter (Signed)
Patient was seen at [facility] ED on 10/05/17 for rectal bleeding-has a history of ulcerative colitis and an anal tear that has been surgically repaired(not noted of place).  Patient can see any surgeon--New patient.  I have called patient. No answer. I have left a message on voicemail for patient to call back to make an appointment.

## 2017-10-12 NOTE — Telephone Encounter (Signed)
Patient has called back and left a message on voicemail to make an appointment per ED referral-note in previous message.   I have called patient back. No answer. I have left another voicemail. Patient can be seen with any surgeon.

## 2017-10-17 ENCOUNTER — Other Ambulatory Visit: Payer: Self-pay

## 2017-10-18 ENCOUNTER — Encounter: Payer: Self-pay | Admitting: Surgery

## 2017-10-18 ENCOUNTER — Ambulatory Visit: Payer: BLUE CROSS/BLUE SHIELD | Admitting: Surgery

## 2017-10-18 VITALS — BP 132/89 | HR 87 | Temp 98.1°F | Ht 67.0 in | Wt 202.2 lb

## 2017-10-18 DIAGNOSIS — K641 Second degree hemorrhoids: Secondary | ICD-10-CM | POA: Diagnosis not present

## 2017-10-18 NOTE — Progress Notes (Signed)
Surgical Consultation  10/18/2017  Carol Davis is an 61 y.o. female.   [id number]:anal fissure  Consult requested by emergency room physician for anal fissure  HPI: This patient was in the emergency room with rectal bleeding and an anal fissure was identified.  She has a history of ulcerative colitis according to the emergency room physician's notes. Patient states that she was diagnosed with ulcerative colitis at age 61 has had fairly regular colonoscopies since then and has a strong family history with multiple family members of UC.  She states her last colonoscopy was about 4 years ago by Dr. Mechele CollinElliott and states that she thinks Dr. Mechele CollinElliott was skeptical of her diagnosis.  Today she describes minimal if any pain but blood on her stool.  Her pain when she has it is constant and not related to bowel movements necessarily.   Past Medical History:  Diagnosis Date  . Anemia    IDA  . HSV-2 (herpes simplex virus 2) infection   . Leucopenia   . STD (sexually transmitted disease)    pos gonorrhea  . Thyroid disease     Past Surgical History:  Procedure Laterality Date  . AUGMENTATION MAMMAPLASTY  10/2016   removed   . TOTAL HIP ARTHROPLASTY Left     Family History  Problem Relation Age of Onset  . Diabetes Mother   . Diabetes Father   . Diabetes Brother   . Breast cancer Neg Hx   . Ovarian cancer Neg Hx   . Colon cancer Neg Hx   . Heart disease Neg Hx     Social History:  reports that  has never smoked. she has never used smokeless tobacco. She reports that she does not drink alcohol or use drugs.  Allergies: No Known Allergies  Medications reviewed.   Review of Systems:   Review of Systems  Constitutional: Negative.   HENT: Negative.   Eyes: Negative.   Respiratory: Negative.   Cardiovascular: Negative.   Gastrointestinal: Positive for blood in stool. Negative for abdominal pain, constipation, diarrhea, heartburn, melena, nausea and vomiting.  Genitourinary:  Negative.   Musculoskeletal: Negative.   Skin: Negative.   Neurological: Negative.   Endo/Heme/Allergies: Negative.   Psychiatric/Behavioral: Negative.      Physical Exam:  There were no vitals taken for this visit.  Physical Exam  Constitutional: She is oriented to person, place, and time and well-developed, well-nourished, and in no distress. No distress.  HENT:  Head: Normocephalic and atraumatic.  Eyes: Pupils are equal, round, and reactive to light. Right eye exhibits no discharge. Left eye exhibits no discharge. No scleral icterus.  Neck: Normal range of motion. No JVD present.  Cardiovascular: Normal rate and regular rhythm.  Pulmonary/Chest: Effort normal. No respiratory distress.  Abdominal: Soft. She exhibits no distension. There is no tenderness. There is no rebound.  Genitourinary:  Genitourinary Comments: Rectal exam: No external hemorrhoids Minimal internal hemorrhoids no blood or hard stool present  No sign of a anal fissure  Musculoskeletal: Normal range of motion. She exhibits no edema or tenderness.  Lymphadenopathy:    She has no cervical adenopathy.  Neurological: She is alert and oriented to person, place, and time.  Skin: Skin is warm and dry. No rash noted. She is not diaphoretic. No erythema.  Psychiatric: Mood and affect normal.  Vitals reviewed.     No results found for this or any previous visit (from the past 48 hour(s)). No results found.  Assessment/Plan:  This is a patient with  a strong family history of ulcerative colitis and a personal history of ulcerative colitis diagnosed at age 61.  She has had routine colonoscopies and the most recent one 4 years ago brought up the question of whether or not she actually has a UC colon cancer at this point.  She states that she had surgery for something very similar to this where they said "sutured it up" believing this to be a rectal fissure.  Her pain process is not consistent with an anal fissure.   I believe she has internal hemorrhoids.  She is not using any medications at this point to control this.  Discussed utilization of properly applied Preparation H etc. I do not think that she needs steroids at this time.  She has minimal swelling and no blood on exam.  I also discussed with her the fact that she has had some sort of surgery in the anal area for and this question of ulcerative colitis I would refer her to a colorectal surgeon if surgery were indicated.  She has had a colonoscopy in the last 4 or 5 years that did not show any sign of active UC.  This brings into question whether or not she actually has UC.  She states that her brother has it and every time he goes to the state [place] he has to run to find the bathroom.  That is not a sign of obvious UC and in the patient with this patient's age that she would have had multiple surgeries before and upon probably experience some sort of colon cancer or precancerous polyps etc. this is all reviewed with her she will follow-up with me in 3 weeks.  Lattie Hawichard E Hira Trent, MD, FACS

## 2017-10-18 NOTE — Telephone Encounter (Signed)
Appointment has been made with Dr Excell Seltzerooper on 10/18/17.

## 2017-10-18 NOTE — Patient Instructions (Signed)
Please try Preparation H cream or suppositories for relief of your Hemorrhoids once daily for 1 week, then start using every 3rd day.  Please be sure to clean your self well after each bowel movement.   Please see your follow up appointment listed below.

## 2017-11-22 ENCOUNTER — Ambulatory Visit: Payer: BLUE CROSS/BLUE SHIELD | Admitting: Surgery

## 2017-12-06 DIAGNOSIS — H33312 Horseshoe tear of retina without detachment, left eye: Secondary | ICD-10-CM | POA: Diagnosis not present

## 2018-02-23 DIAGNOSIS — E038 Other specified hypothyroidism: Secondary | ICD-10-CM | POA: Diagnosis not present

## 2018-02-23 DIAGNOSIS — R5383 Other fatigue: Secondary | ICD-10-CM | POA: Diagnosis not present

## 2018-02-23 DIAGNOSIS — E063 Autoimmune thyroiditis: Secondary | ICD-10-CM | POA: Diagnosis not present

## 2018-02-27 ENCOUNTER — Other Ambulatory Visit: Payer: BLUE CROSS/BLUE SHIELD

## 2018-02-27 DIAGNOSIS — E038 Other specified hypothyroidism: Secondary | ICD-10-CM

## 2018-02-27 DIAGNOSIS — R5383 Other fatigue: Secondary | ICD-10-CM

## 2018-02-28 LAB — COMPREHENSIVE METABOLIC PANEL
ALT: 11 IU/L (ref 0–32)
AST: 15 IU/L (ref 0–40)
Albumin/Globulin Ratio: 1.6 (ref 1.2–2.2)
Albumin: 3.9 g/dL (ref 3.6–4.8)
Alkaline Phosphatase: 53 IU/L (ref 39–117)
BUN/Creatinine Ratio: 24 (ref 12–28)
BUN: 16 mg/dL (ref 8–27)
Bilirubin Total: 0.3 mg/dL (ref 0.0–1.2)
CALCIUM: 8.8 mg/dL (ref 8.7–10.3)
CO2: 24 mmol/L (ref 20–29)
CREATININE: 0.68 mg/dL (ref 0.57–1.00)
Chloride: 106 mmol/L (ref 96–106)
GFR calc Af Amer: 110 mL/min/{1.73_m2} (ref >59)
GFR, EST NON AFRICAN AMERICAN: 95 mL/min/{1.73_m2} (ref >59)
Globulin, Total: 2.4 g/dL (ref 1.5–4.5)
Glucose: 88 mg/dL (ref 65–99)
Potassium: 4.7 mmol/L (ref 3.5–5.2)
Sodium: 143 mmol/L (ref 134–144)
Total Protein: 6.3 g/dL (ref 6.0–8.5)

## 2018-02-28 LAB — CBC WITH DIFFERENTIAL/PLATELET
Basophils Absolute: 0 10*3/uL (ref 0.0–0.2)
Basos: 1 %
EOS (ABSOLUTE): 0.2 10*3/uL (ref 0.0–0.4)
EOS: 6 %
HEMATOCRIT: 41 % (ref 34.0–46.6)
Hemoglobin: 13.3 g/dL (ref 11.1–15.9)
IMMATURE GRANS (ABS): 0 10*3/uL (ref 0.0–0.1)
IMMATURE GRANULOCYTES: 0 %
LYMPHS: 33 %
Lymphocytes Absolute: 1.1 10*3/uL (ref 0.7–3.1)
MCH: 29.8 pg (ref 26.6–33.0)
MCHC: 32.4 g/dL (ref 31.5–35.7)
MCV: 92 fL (ref 79–97)
MONOS ABS: 0.3 10*3/uL (ref 0.1–0.9)
Monocytes: 9 %
NEUTROS PCT: 51 %
Neutrophils Absolute: 1.6 10*3/uL (ref 1.4–7.0)
PLATELETS: 216 10*3/uL (ref 150–379)
RBC: 4.46 x10E6/uL (ref 3.77–5.28)
RDW: 13.7 % (ref 12.3–15.4)
WBC: 3.2 10*3/uL — AB (ref 3.4–10.8)

## 2018-02-28 LAB — VITAMIN D 25 HYDROXY (VIT D DEFICIENCY, FRACTURES): Vit D, 25-Hydroxy: 41.4 ng/mL (ref 30.0–100.0)

## 2018-02-28 LAB — TSH: TSH: 0.045 u[IU]/mL — ABNORMAL LOW (ref 0.450–4.500)

## 2018-03-28 DIAGNOSIS — M170 Bilateral primary osteoarthritis of knee: Secondary | ICD-10-CM | POA: Diagnosis not present

## 2018-04-09 DIAGNOSIS — K219 Gastro-esophageal reflux disease without esophagitis: Secondary | ICD-10-CM | POA: Diagnosis not present

## 2018-04-09 DIAGNOSIS — Z86010 Personal history of colonic polyps: Secondary | ICD-10-CM | POA: Diagnosis not present

## 2018-04-09 DIAGNOSIS — Z8719 Personal history of other diseases of the digestive system: Secondary | ICD-10-CM | POA: Diagnosis not present

## 2018-05-29 DIAGNOSIS — Z86010 Personal history of colonic polyps: Secondary | ICD-10-CM | POA: Diagnosis not present

## 2018-05-29 DIAGNOSIS — D126 Benign neoplasm of colon, unspecified: Secondary | ICD-10-CM | POA: Diagnosis not present

## 2018-05-29 DIAGNOSIS — D120 Benign neoplasm of cecum: Secondary | ICD-10-CM | POA: Diagnosis not present

## 2018-05-29 DIAGNOSIS — K635 Polyp of colon: Secondary | ICD-10-CM | POA: Diagnosis not present

## 2018-05-29 DIAGNOSIS — K640 First degree hemorrhoids: Secondary | ICD-10-CM | POA: Diagnosis not present

## 2018-05-29 DIAGNOSIS — K648 Other hemorrhoids: Secondary | ICD-10-CM | POA: Diagnosis not present

## 2018-06-08 NOTE — Progress Notes (Signed)
ANNUAL PREVENTATIVE CARE GYN  ENCOUNTER NOTE  Subjective:       Carol Davis is a 62 y.o. G0P0 female here for a routine annual gynecologic exam.  Current complaints: 1.  none    62 year old white female, para 0, menopausal, on no hormone replacement therapy, without vasomotor symptoms or symptoms of urogenital atrophy. 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  Patient had onset of a new relationship after 2015 with subsequent development gonorrhea that was treated.  August 2018 STD screen was negative for chlamydia, gonorrhea, HIV, hepatitis B, hepatitis C, and syphilis.   Bowel function is normal; she has a remote history of ulcerative colitis years ago.  Screening colonoscopy 2 weeks ago was notable for a benign polyp; next colonoscopy is due 2024 She does have history of hypothyroidism. Patient had history of breast implants and is status post implant removal following rupture of the implants.  She has not had any abnormal mammograms.  She notes occasional twinges in her right breast without palpable abnormal mass.  Patient is a recovering alcoholic and is not partaking any alcohol.   Gynecologic History No LMP recorded. Patient is postmenopausal. Contraception: post menopausal status Last Pap: 2018 neg/neg/neg . Results were: normal Last mammogram: 2017 birad 1. Results were: normal  Obstetric History OB History  Gravida Para Term Preterm AB Living  0            SAB TAB Ectopic Multiple Live Births               Past Medical History:  Diagnosis Date  . Anemia    IDA  . HSV-2 (herpes simplex virus 2) infection   . Leucopenia   . STD (sexually transmitted disease)    pos gonorrhea  . Thyroid disease     Past Surgical History:  Procedure Laterality Date  . ANUS SURGERY  6-10 years ago  . AUGMENTATION MAMMAPLASTY  10/2016   removed   . COLONOSCOPY  within last 3 years  . TOTAL HIP ARTHROPLASTY Left     Current  Outpatient Medications on File Prior to Visit  Medication Sig Dispense Refill  . benzonatate (TESSALON) 200 MG capsule Take 1 capsule (200 mg total) by mouth 2 (two) times daily as needed for cough. May cause drowsiness do not drive 20 capsule 0  . levothyroxine (LEVOXYL) 100 MCG tablet Take 100 mcg by mouth once.     No current facility-administered medications on file prior to visit.     No Known Allergies  Social History   Socioeconomic History  . Marital status: Single    Spouse name: Not on file  . Number of children: Not on file  . Years of education: Not on file  . Highest education level: Not on file  Occupational History  . Not on file  Social Needs  . Financial resource strain: Not on file  . Food insecurity:    Worry: Not on file    Inability: Not on file  . Transportation needs:    Medical: Not on file    Non-medical: Not on file  Tobacco Use  . Smoking status: Never Smoker  . Smokeless tobacco: Never Used  . Tobacco comment: quit 30 years ago  Substance and Sexual Activity  . Alcohol use: No  . Drug use: No  . Sexual activity: Yes    Birth control/protection: Post-menopausal  Lifestyle  . Physical activity:    Days per week: Not on  file    Minutes per session: Not on file  . Stress: Not on file  Relationships  . Social connections:    Talks on phone: Not on file    Gets together: Not on file    Attends religious service: Not on file    Active member of club or organization: Not on file    Attends meetings of clubs or organizations: Not on file    Relationship status: Not on file  . Intimate partner violence:    Fear of current or ex partner: Not on file    Emotionally abused: Not on file    Physically abused: Not on file    Forced sexual activity: Not on file  Other Topics Concern  . Not on file  Social History Narrative  . Not on file    Family History  Problem Relation Age of Onset  . Diabetes Mother   . Diabetes Father   . Diabetes  Brother   . Breast cancer Neg Hx   . Ovarian cancer Neg Hx   . Colon cancer Neg Hx   . Heart disease Neg Hx     The following portions of the patient's history were reviewed and updated as appropriate: allergies, current medications, past family history, past medical history, past social history, past surgical history and problem list.  Review of Systems Review of Systems  Constitutional: Negative.   HENT: Negative.   Respiratory: Negative.   Cardiovascular: Negative.   Genitourinary: Negative.   Musculoskeletal: Negative.   Skin: Negative.   Neurological: Negative.   Endo/Heme/Allergies: Negative.   Psychiatric/Behavioral: Negative.     Objective:   BP 124/72   Pulse 73   Ht 5\' 7"  (1.702 m)   Wt 210 lb 1.6 oz (95.3 kg)   BMI 32.91 kg/m  CONSTITUTIONAL: Well-developed, well-nourished female in no acute distress.  PSYCHIATRIC: Normal mood and affect. Normal behavior. Normal judgment and thought content. NEUROLGIC: Alert and oriented to person, place, and time. Normal muscle tone coordination. No cranial nerve deficit noted. HENT:  Normocephalic, atraumatic, External right and left ear normal.  EYES: Conjunctivae and EOM are normal. No scleral icterus.  NECK: Normal range of motion, supple, no masses.  Normal thyroid.  SKIN: Skin is warm and dry. No rash noted. Not diaphoretic. No erythema. No pallor. CARDIOVASCULAR: Normal heart rate noted, regular rhythm, no murmur. RESPIRATORY: Clear to auscultation bilaterally. Effort and breath sounds normal, no problems with respiration noted. BREASTS: Symmetric in size. No masses, skin changes, nipple drainage, or lymphadenopathy.Post mammoplasty, scarring, well-healed. ABDOMEN: Soft, normal bowel sounds, no distention noted.  No tenderness, rebound or guarding.  BLADDER: Normal PELVIC:  External Genitalia: Normal  BUS: Normal  Vagina: Mild to moderate atrophy  Cervix: Normal; No lesions; no discharge  ` Uterus: Normal; Midplane,  normal size and shape, mobile, nontender  Adnexa: Normal  RV: Internal exam deferred due to recent colonoscopy and External Exam NormaI  MUSCULOSKELETAL: Normal range of motion. No tenderness.  No cyanosis, clubbing, or edema.  2+ distal pulses. LYMPHATIC: No Axillary, Supraclavicular, or Inguinal Adenopathy.    Assessment:   Annual gynecologic examination 62 y.o. Contraception: post menopausal status [id number] Menopause. History of ulcerative colitis, inactive. History of HSV 2, infrequent outbreaks. Recovering alcoholic, abstinent  Plan:  Pap:Due 2021 Mammogram: Ordered Stool Guaiac Testing:  Colonoscopy 2 weeks ago- polyp removed; next colonoscopy due 2024 Labs: AC1 fbs tsh lipid Routine preventative health maintenance measures emphasized: Exercise/Diet/Weight control, Tobacco Warnings, Alcohol/Substance use risks, Stress Management and  Peer Pressure Issues  Calcium with vitamin D supplementation daily strongly encouraged Return to Clinic - 1 Year   Crystal [place], CMA  Herold Harms, MD   Note: This dictation was prepared with Dragon dictation along with smaller phrase technology. Any transcriptional errors that result from this process are unintentional.

## 2018-06-21 ENCOUNTER — Encounter: Payer: Self-pay | Admitting: Obstetrics and Gynecology

## 2018-06-21 ENCOUNTER — Ambulatory Visit (INDEPENDENT_AMBULATORY_CARE_PROVIDER_SITE_OTHER): Payer: BLUE CROSS/BLUE SHIELD | Admitting: Obstetrics and Gynecology

## 2018-06-21 VITALS — BP 124/72 | HR 73 | Ht 67.0 in | Wt 210.1 lb

## 2018-06-21 DIAGNOSIS — Z1231 Encounter for screening mammogram for malignant neoplasm of breast: Secondary | ICD-10-CM | POA: Diagnosis not present

## 2018-06-21 DIAGNOSIS — Z01419 Encounter for gynecological examination (general) (routine) without abnormal findings: Secondary | ICD-10-CM | POA: Diagnosis not present

## 2018-06-21 DIAGNOSIS — Z1211 Encounter for screening for malignant neoplasm of colon: Secondary | ICD-10-CM | POA: Diagnosis not present

## 2018-06-21 DIAGNOSIS — B009 Herpesviral infection, unspecified: Secondary | ICD-10-CM

## 2018-06-21 DIAGNOSIS — F1021 Alcohol dependence, in remission: Secondary | ICD-10-CM | POA: Insufficient documentation

## 2018-06-21 DIAGNOSIS — Z1239 Encounter for other screening for malignant neoplasm of breast: Secondary | ICD-10-CM

## 2018-06-21 DIAGNOSIS — R638 Other symptoms and signs concerning food and fluid intake: Secondary | ICD-10-CM

## 2018-06-21 NOTE — Patient Instructions (Addendum)
1.  Pap smear is not done.  Next Pap smear is due 2021. 2.  Mammogram is ordered. 3.  Colonoscopy August 2019 showed polyp, pathology pending 4.  Screening labs are ordered 5.  Calcium with vitamin D supplementation twice daily as recommended 6.  Continue with healthy eating, exercise, and control weight loss.  Weight loss goal is 1 pound per month. 7.  Return in 1 year for annual exam   Health Maintenance for Postmenopausal Women Menopause is a normal process in which your reproductive ability comes to an end. This process happens gradually over a span of months to years, usually between the ages of 61 and 69. Menopause is complete when you have missed 12 consecutive menstrual periods. It is important to talk with your health care provider about some of the most common conditions that affect postmenopausal women, such as heart disease, cancer, and bone loss (osteoporosis). Adopting a healthy lifestyle and getting preventive care can help to promote your health and wellness. Those actions can also lower your chances of developing some of these common conditions. What should I know about menopause? During menopause, you may experience a number of symptoms, such as:  Moderate-to-severe hot flashes.  Night sweats.  Decrease in sex drive.  Mood swings.  Headaches.  Tiredness.  Irritability.  Memory problems.  Insomnia.  Choosing to treat or not to treat menopausal changes is an individual decision that you make with your health care provider. What should I know about hormone replacement therapy and supplements? Hormone therapy products are effective for treating symptoms that are associated with menopause, such as hot flashes and night sweats. Hormone replacement carries certain risks, especially as you become older. If you are thinking about using estrogen or estrogen with progestin treatments, discuss the benefits and risks with your health care provider. What should I know about  heart disease and stroke? Heart disease, heart attack, and stroke become more likely as you age. This may be due, in part, to the hormonal changes that your body experiences during menopause. These can affect how your body processes dietary fats, triglycerides, and cholesterol. Heart attack and stroke are both medical emergencies. There are many things that you can do to help prevent heart disease and stroke:  Have your blood pressure checked at least every 1-2 years. High blood pressure causes heart disease and increases the risk of stroke.  If you are 80-46 years old, ask your health care provider if you should take aspirin to prevent a heart attack or a stroke.  Do not use any tobacco products, including cigarettes, chewing tobacco, or electronic cigarettes. If you need help quitting, ask your health care provider.  It is important to eat a healthy diet and maintain a healthy weight. ? Be sure to include plenty of vegetables, fruits, low-fat dairy products, and lean protein. ? Avoid eating foods that are high in solid fats, added sugars, or salt (sodium).  Get regular exercise. This is one of the most important things that you can do for your health. ? Try to exercise for at least 150 minutes each week. The type of exercise that you do should increase your heart rate and make you sweat. This is known as moderate-intensity exercise. ? Try to do strengthening exercises at least twice each week. Do these in addition to the moderate-intensity exercise.  Know your numbers.Ask your health care provider to check your cholesterol and your blood glucose. Continue to have your blood tested as directed by your health care  provider.  What should I know about cancer screening? There are several types of cancer. Take the following steps to reduce your risk and to catch any cancer development as early as possible. Breast Cancer  Practice breast self-awareness. ? This means understanding how your  breasts normally appear and feel. ? It also means doing regular breast self-exams. Let your health care provider know about any changes, no matter how small.  If you are 55 or older, have a clinician do a breast exam (clinical breast exam or CBE) every year. Depending on your age, family history, and medical history, it may be recommended that you also have a yearly breast X-ray (mammogram).  If you have a family history of breast cancer, talk with your health care provider about genetic screening.  If you are at high risk for breast cancer, talk with your health care provider about having an MRI and a mammogram every year.  Breast cancer (BRCA) gene test is recommended for women who have family members with BRCA-related cancers. Results of the assessment will determine the need for genetic counseling and BRCA1 and for BRCA2 testing. BRCA-related cancers include these types: ? Breast. This occurs in males or females. ? Ovarian. ? Tubal. This may also be called fallopian tube cancer. ? Cancer of the abdominal or pelvic lining (peritoneal cancer). ? Prostate. ? Pancreatic.  Cervical, Uterine, and Ovarian Cancer Your health care provider may recommend that you be screened regularly for cancer of the pelvic organs. These include your ovaries, uterus, and vagina. This screening involves a pelvic exam, which includes checking for microscopic changes to the surface of your cervix (Pap test).  For women ages 21-65, health care providers may recommend a pelvic exam and a Pap test every three years. For women ages 33-65, they may recommend the Pap test and pelvic exam, combined with testing for human papilloma virus (HPV), every five years. Some types of HPV increase your risk of cervical cancer. Testing for HPV may also be done on women of any age who have unclear Pap test results.  Other health care providers may not recommend any screening for nonpregnant women who are considered low risk for pelvic  cancer and have no symptoms. Ask your health care provider if a screening pelvic exam is right for you.  If you have had past treatment for cervical cancer or a condition that could lead to cancer, you need Pap tests and screening for cancer for at least 20 years after your treatment. If Pap tests have been discontinued for you, your risk factors (such as having a new sexual partner) need to be reassessed to determine if you should start having screenings again. Some women have medical problems that increase the chance of getting cervical cancer. In these cases, your health care provider may recommend that you have screening and Pap tests more often.  If you have a family history of uterine cancer or ovarian cancer, talk with your health care provider about genetic screening.  If you have vaginal bleeding after reaching menopause, tell your health care provider.  There are currently no reliable tests available to screen for ovarian cancer.  Lung Cancer Lung cancer screening is recommended for adults 46-64 years old who are at high risk for lung cancer because of a history of smoking. A yearly low-dose CT scan of the lungs is recommended if you:  Currently smoke.  Have a history of at least 30 pack-years of smoking and you currently smoke or have quit within  the past 15 years. A pack-year is smoking an average of one pack of cigarettes per day for one year.  Yearly screening should:  Continue until it has been 15 years since you quit.  Stop if you develop a health problem that would prevent you from having lung cancer treatment.  Colorectal Cancer  This type of cancer can be detected and can often be prevented.  Routine colorectal cancer screening usually begins at age 62 and continues through age 2.  If you have risk factors for colon cancer, your health care provider may recommend that you be screened at an earlier age.  If you have a family history of colorectal cancer, talk with  your health care provider about genetic screening.  Your health care provider may also recommend using home test kits to check for hidden blood in your stool.  A small camera at the end of a tube can be used to examine your colon directly (sigmoidoscopy or colonoscopy). This is done to check for the earliest forms of colorectal cancer.  Direct examination of the colon should be repeated every 5-10 years until age 26. However, if early forms of precancerous polyps or small growths are found or if you have a family history or genetic risk for colorectal cancer, you may need to be screened more often.  Skin Cancer  Check your skin from head to toe regularly.  Monitor any moles. Be sure to tell your health care provider: ? About any new moles or changes in moles, especially if there is a change in a mole's shape or color. ? If you have a mole that is larger than the size of a pencil eraser.  If any of your family members has a history of skin cancer, especially at a young age, talk with your health care provider about genetic screening.  Always use sunscreen. Apply sunscreen liberally and repeatedly throughout the day.  Whenever you are outside, protect yourself by wearing long sleeves, pants, a wide-brimmed hat, and sunglasses.  What should I know about osteoporosis? Osteoporosis is a condition in which bone destruction happens more quickly than new bone creation. After menopause, you may be at an increased risk for osteoporosis. To help prevent osteoporosis or the bone fractures that can happen because of osteoporosis, the following is recommended:  If you are 61-16 years old, get at least 1,000 mg of calcium and at least 600 mg of vitamin D per day.  If you are older than age 44 but younger than age 45, get at least 1,200 mg of calcium and at least 600 mg of vitamin D per day.  If you are older than age 42, get at least 1,200 mg of calcium and at least 800 mg of vitamin D per  day.  Smoking and excessive alcohol intake increase the risk of osteoporosis. Eat foods that are rich in calcium and vitamin D, and do weight-bearing exercises several times each week as directed by your health care provider. What should I know about how menopause affects my mental health? Depression may occur at any age, but it is more common as you become older. Common symptoms of depression include:  Low or sad mood.  Changes in sleep patterns.  Changes in appetite or eating patterns.  Feeling an overall lack of motivation or enjoyment of activities that you previously enjoyed.  Frequent crying spells.  Talk with your health care provider if you think that you are experiencing depression. What should I know about immunizations? It  is important that you get and maintain your immunizations. These include:  Tetanus, diphtheria, and pertussis (Tdap) booster vaccine.  Influenza every year before the flu season begins.  Pneumonia vaccine.  Shingles vaccine.  Your health care provider may also recommend other immunizations. This information is not intended to replace advice given to you by your health care provider. Make sure you discuss any questions you have with your health care provider. Document Released: 12/09/2005 Document Revised: 05/06/2016 Document Reviewed: 07/21/2015 Elsevier Interactive Patient Education  2018 [employer]. 1.  Pap smear is not done.  Next Pap smear is due 2021. 2.  Mammogram is ordered. 3.  Screening for colon cancer completed with screening colonoscopy this year.  Next colonoscopy is due 2024. 4.  Screening labs are ordered. 5.  Recommend calcium 600 mg twice a day and vitamin D 400 international units twice a day 7.  Continue with healthy eating, exercise, and control weight loss.  Weight loss goal is 1 pound per month 8.  Return in 1 year for annual exam   Health Maintenance for Postmenopausal Women Menopause is a normal process in which your  reproductive ability comes to an end. This process happens gradually over a span of months to years, usually between the ages of 67 and 37. Menopause is complete when you have missed 12 consecutive menstrual periods. It is important to talk with your health care provider about some of the most common conditions that affect postmenopausal women, such as heart disease, cancer, and bone loss (osteoporosis). Adopting a healthy lifestyle and getting preventive care can help to promote your health and wellness. Those actions can also lower your chances of developing some of these common conditions. What should I know about menopause? During menopause, you may experience a number of symptoms, such as:  Moderate-to-severe hot flashes.  Night sweats.  Decrease in sex drive.  Mood swings.  Headaches.  Tiredness.  Irritability.  Memory problems.  Insomnia.  Choosing to treat or not to treat menopausal changes is an individual decision that you make with your health care provider. What should I know about hormone replacement therapy and supplements? Hormone therapy products are effective for treating symptoms that are associated with menopause, such as hot flashes and night sweats. Hormone replacement carries certain risks, especially as you become older. If you are thinking about using estrogen or estrogen with progestin treatments, discuss the benefits and risks with your health care provider. What should I know about heart disease and stroke? Heart disease, heart attack, and stroke become more likely as you age. This may be due, in part, to the hormonal changes that your body experiences during menopause. These can affect how your body processes dietary fats, triglycerides, and cholesterol. Heart attack and stroke are both medical emergencies. There are many things that you can do to help prevent heart disease and stroke:  Have your blood pressure checked at least every 1-2 years. High blood  pressure causes heart disease and increases the risk of stroke.  If you are 32-46 years old, ask your health care provider if you should take aspirin to prevent a heart attack or a stroke.  Do not use any tobacco products, including cigarettes, chewing tobacco, or electronic cigarettes. If you need help quitting, ask your health care provider.  It is important to eat a healthy diet and maintain a healthy weight. ? Be sure to include plenty of vegetables, fruits, low-fat dairy products, and lean protein. ? Avoid eating foods that are high  in solid fats, added sugars, or salt (sodium).  Get regular exercise. This is one of the most important things that you can do for your health. ? Try to exercise for at least 150 minutes each week. The type of exercise that you do should increase your heart rate and make you sweat. This is known as moderate-intensity exercise. ? Try to do strengthening exercises at least twice each week. Do these in addition to the moderate-intensity exercise.  Know your numbers.Ask your health care provider to check your cholesterol and your blood glucose. Continue to have your blood tested as directed by your health care provider.  What should I know about cancer screening? There are several types of cancer. Take the following steps to reduce your risk and to catch any cancer development as early as possible. Breast Cancer  Practice breast self-awareness. ? This means understanding how your breasts normally appear and feel. ? It also means doing regular breast self-exams. Let your health care provider know about any changes, no matter how small.  If you are 89 or older, have a clinician do a breast exam (clinical breast exam or CBE) every year. Depending on your age, family history, and medical history, it may be recommended that you also have a yearly breast X-ray (mammogram).  If you have a family history of breast cancer, talk with your health care provider about  genetic screening.  If you are at high risk for breast cancer, talk with your health care provider about having an MRI and a mammogram every year.  Breast cancer (BRCA) gene test is recommended for women who have family members with BRCA-related cancers. Results of the assessment will determine the need for genetic counseling and BRCA1 and for BRCA2 testing. BRCA-related cancers include these types: ? Breast. This occurs in males or females. ? Ovarian. ? Tubal. This may also be called fallopian tube cancer. ? Cancer of the abdominal or pelvic lining (peritoneal cancer). ? Prostate. ? Pancreatic.  Cervical, Uterine, and Ovarian Cancer Your health care provider may recommend that you be screened regularly for cancer of the pelvic organs. These include your ovaries, uterus, and vagina. This screening involves a pelvic exam, which includes checking for microscopic changes to the surface of your cervix (Pap test).  For women ages 21-65, health care providers may recommend a pelvic exam and a Pap test every three years. For women ages 65-65, they may recommend the Pap test and pelvic exam, combined with testing for human papilloma virus (HPV), every five years. Some types of HPV increase your risk of cervical cancer. Testing for HPV may also be done on women of any age who have unclear Pap test results.  Other health care providers may not recommend any screening for nonpregnant women who are considered low risk for pelvic cancer and have no symptoms. Ask your health care provider if a screening pelvic exam is right for you.  If you have had past treatment for cervical cancer or a condition that could lead to cancer, you need Pap tests and screening for cancer for at least 20 years after your treatment. If Pap tests have been discontinued for you, your risk factors (such as having a new sexual partner) need to be reassessed to determine if you should start having screenings again. Some women have  medical problems that increase the chance of getting cervical cancer. In these cases, your health care provider may recommend that you have screening and Pap tests more often.  If you  have a family history of uterine cancer or ovarian cancer, talk with your health care provider about genetic screening.  If you have vaginal bleeding after reaching menopause, tell your health care provider.  There are currently no reliable tests available to screen for ovarian cancer.  Lung Cancer Lung cancer screening is recommended for adults 5-76 years old who are at high risk for lung cancer because of a history of smoking. A yearly low-dose CT scan of the lungs is recommended if you:  Currently smoke.  Have a history of at least 30 pack-years of smoking and you currently smoke or have quit within the past 15 years. A pack-year is smoking an average of one pack of cigarettes per day for one year.  Yearly screening should:  Continue until it has been 15 years since you quit.  Stop if you develop a health problem that would prevent you from having lung cancer treatment.  Colorectal Cancer  This type of cancer can be detected and can often be prevented.  Routine colorectal cancer screening usually begins at age 48 and continues through age 62.  If you have risk factors for colon cancer, your health care provider may recommend that you be screened at an earlier age.  If you have a family history of colorectal cancer, talk with your health care provider about genetic screening.  Your health care provider may also recommend using home test kits to check for hidden blood in your stool.  A small camera at the end of a tube can be used to examine your colon directly (sigmoidoscopy or colonoscopy). This is done to check for the earliest forms of colorectal cancer.  Direct examination of the colon should be repeated every 5-10 years until age 69. However, if early forms of precancerous polyps or small  growths are found or if you have a family history or genetic risk for colorectal cancer, you may need to be screened more often.  Skin Cancer  Check your skin from head to toe regularly.  Monitor any moles. Be sure to tell your health care provider: ? About any new moles or changes in moles, especially if there is a change in a mole's shape or color. ? If you have a mole that is larger than the size of a pencil eraser.  If any of your family members has a history of skin cancer, especially at a young age, talk with your health care provider about genetic screening.  Always use sunscreen. Apply sunscreen liberally and repeatedly throughout the day.  Whenever you are outside, protect yourself by wearing long sleeves, pants, a wide-brimmed hat, and sunglasses.  What should I know about osteoporosis? Osteoporosis is a condition in which bone destruction happens more quickly than new bone creation. After menopause, you may be at an increased risk for osteoporosis. To help prevent osteoporosis or the bone fractures that can happen because of osteoporosis, the following is recommended:  If you are 35-98 years old, get at least 1,000 mg of calcium and at least 600 mg of vitamin D per day.  If you are older than age 45 but younger than age 13, get at least 1,200 mg of calcium and at least 600 mg of vitamin D per day.  If you are older than age 42, get at least 1,200 mg of calcium and at least 800 mg of vitamin D per day.  Smoking and excessive alcohol intake increase the risk of osteoporosis. Eat foods that are rich in calcium and  vitamin D, and do weight-bearing exercises several times each week as directed by your health care provider. What should I know about how menopause affects my mental health? Depression may occur at any age, but it is more common as you become older. Common symptoms of depression include:  Low or sad mood.  Changes in sleep patterns.  Changes in appetite or eating  patterns.  Feeling an overall lack of motivation or enjoyment of activities that you previously enjoyed.  Frequent crying spells.  Talk with your health care provider if you think that you are experiencing depression. What should I know about immunizations? It is important that you get and maintain your immunizations. These include:  Tetanus, diphtheria, and pertussis (Tdap) booster vaccine.  Influenza every year before the flu season begins.  Pneumonia vaccine.  Shingles vaccine.  Your health care provider may also recommend other immunizations. This information is not intended to replace advice given to you by your health care provider. Make sure you discuss any questions you have with your health care provider. Document Released: 12/09/2005 Document Revised: 05/06/2016 Document Reviewed: 07/21/2015 Elsevier Interactive Patient Education  2018 [employer].

## 2018-07-02 DIAGNOSIS — Z23 Encounter for immunization: Secondary | ICD-10-CM | POA: Diagnosis not present

## 2018-07-06 ENCOUNTER — Other Ambulatory Visit: Payer: BLUE CROSS/BLUE SHIELD

## 2018-07-06 DIAGNOSIS — Z01419 Encounter for gynecological examination (general) (routine) without abnormal findings: Secondary | ICD-10-CM | POA: Diagnosis not present

## 2018-07-06 DIAGNOSIS — R638 Other symptoms and signs concerning food and fluid intake: Secondary | ICD-10-CM | POA: Diagnosis not present

## 2018-07-07 LAB — GLUCOSE, RANDOM: Glucose: 87 mg/dL (ref 65–99)

## 2018-07-07 LAB — LIPID PANEL
CHOLESTEROL TOTAL: 192 mg/dL (ref 100–199)
Chol/HDL Ratio: 2.4 ratio (ref 0.0–4.4)
HDL: 81 mg/dL (ref >39)
LDL Calculated: 102 mg/dL — ABNORMAL HIGH (ref 0–99)
TRIGLYCERIDES: 44 mg/dL (ref 0–149)
VLDL CHOLESTEROL CAL: 9 mg/dL (ref 5–40)

## 2018-07-07 LAB — HEMOGLOBIN A1C
ESTIMATED AVERAGE GLUCOSE: 97 mg/dL
HEMOGLOBIN A1C: 5 % (ref 4.8–5.6)

## 2018-07-07 LAB — TSH: TSH: 3.5 u[IU]/mL (ref 0.450–4.500)

## 2018-07-18 DIAGNOSIS — M170 Bilateral primary osteoarthritis of knee: Secondary | ICD-10-CM | POA: Diagnosis not present

## 2018-07-20 ENCOUNTER — Ambulatory Visit
Admission: RE | Admit: 2018-07-20 | Discharge: 2018-07-20 | Disposition: A | Discharge status: Home / Self Care | Payer: BLUE CROSS/BLUE SHIELD | Source: Ambulatory Visit | Attending: Obstetrics and Gynecology | Admitting: Obstetrics and Gynecology

## 2018-07-20 DIAGNOSIS — Z1231 Encounter for screening mammogram for malignant neoplasm of breast: Secondary | ICD-10-CM | POA: Diagnosis not present

## 2018-07-20 DIAGNOSIS — Z1239 Encounter for other screening for malignant neoplasm of breast: Secondary | ICD-10-CM

## 2018-08-14 ENCOUNTER — Encounter: Payer: Self-pay | Admitting: Medical

## 2018-08-14 ENCOUNTER — Ambulatory Visit: Payer: BLUE CROSS/BLUE SHIELD | Admitting: Medical

## 2018-08-14 VITALS — BP 118/79 | HR 71 | Temp 97.8°F | Resp 18 | Wt 210.8 lb

## 2018-08-14 DIAGNOSIS — J0100 Acute maxillary sinusitis, unspecified: Secondary | ICD-10-CM

## 2018-08-14 DIAGNOSIS — J069 Acute upper respiratory infection, unspecified: Secondary | ICD-10-CM

## 2018-08-14 MED ORDER — AMOXICILLIN-POT CLAVULANATE 875-125 MG PO TABS: 1.0000 | ORAL_TABLET | Freq: Two times a day (BID) | ORAL | 0 refills | Status: DC

## 2018-08-14 MED ORDER — BENZONATATE 100 MG PO CAPS: 100.0000 mg | ORAL_CAPSULE | Freq: Three times a day (TID) | ORAL | 0 refills | Status: DC | PRN

## 2018-08-14 NOTE — Patient Instructions (Addendum)
Upper Respiratory Infection, Adult Most upper respiratory infections (URIs) are caused by a virus. A URI affects the nose, throat, and upper air passages. The most common type of URI is often called "the common cold." Follow these instructions at home:  Take medicines only as told by your doctor.  Gargle warm saltwater or take cough drops to comfort your throat as told by your doctor.  Use a warm mist humidifier or inhale steam from a shower to increase air moisture. This may make it easier to breathe.  Drink enough fluid to keep your pee (urine) clear or pale yellow.  Eat soups and other clear broths.  Have a healthy diet.  Rest as needed.  Go back to work when your fever is gone or your doctor says it is okay. ? You may need to stay home longer to avoid giving your URI to others. ? You can also wear a face mask and wash your hands often to prevent spread of the virus.  Use your inhaler more if you have asthma.  Do not use any tobacco products, including cigarettes, chewing tobacco, or electronic cigarettes. If you need help quitting, ask your doctor. Contact a doctor if:  You are getting worse, not better.  Your symptoms are not helped by medicine.  You have chills.  You are getting more short of breath.  You have brown or red mucus.  You have yellow or brown discharge from your nose.  You have pain in your face, especially when you bend forward.  You have a fever.  You have puffy (swollen) neck glands.  You have pain while swallowing.  You have white areas in the back of your throat. Get help right away if:  You have very bad or constant: ? Headache. ? Ear pain. ? Pain in your forehead, behind your eyes, and over your cheekbones (sinus pain). ? Chest pain.  You have long-lasting (chronic) lung disease and any of the following: ? Wheezing. ? Long-lasting cough. ? Coughing up blood. ? A change in your usual mucus.  You have a stiff neck.  You have  changes in your: ? Vision. ? Hearing. ? Thinking. ? Mood. This information is not intended to replace advice given to you by your health care provider. Make sure you discuss any questions you have with your health care provider. Document Released: 04/04/2008 Document Revised: 06/19/2016 Document Reviewed: 01/22/2014 Elsevier Interactive Patient Education  2018 [employer] Eustachian Tube Dysfunction The eustachian tube connects the middle ear to the back of the nose. It regulates air pressure in the middle ear by allowing air to move between the ear and nose. It also helps to drain fluid from the middle ear space. When the eustachian tube does not function properly, air pressure, fluid, or both can build up in the middle ear. Eustachian tube dysfunction can affect one or both ears. What are the causes? This condition happens when the eustachian tube becomes blocked or cannot open normally. This may result from:  Ear infections.  Colds and other upper respiratory infections.  Allergies.  Irritation, such as from cigarette smoke or acid from the stomach coming up into the esophagus (gastroesophageal reflux).  Sudden changes in air pressure, such as from descending in an airplane.  Abnormal growths in the nose or throat, such as nasal polyps, tumors, or enlarged tissue at the back of the throat (adenoids).  What increases the risk? This condition may be more likely to develop in people who smoke and people  who are overweight. Eustachian tube dysfunction may also be more likely to develop in children, especially children who have:  Certain birth defects of the mouth, such as cleft palate.  Large tonsils and adenoids.  What are the signs or symptoms? Symptoms of this condition may include:  A feeling of fullness in the ear.  Ear pain.  Clicking or popping noises in the ear.  Ringing in the ear.  Hearing loss.  Loss of balance.  Symptoms may get worse when the air  pressure around you changes, such as when you travel to an area of high elevation or fly on an airplane. How is this diagnosed? This condition may be diagnosed based on:  Your symptoms.  A physical exam of your ear, nose, and throat.  Tests, such as those that measure: ? The movement of your eardrum (tympanogram). ? Your hearing (audiometry).  How is this treated? Treatment depends on the cause and severity of your condition. If your symptoms are mild, you may be able to relieve your symptoms by moving air into ("popping") your ears. If you have symptoms of fluid in your ears, treatment may include:  Decongestants.  Antihistamines.  Nasal sprays or ear drops that contain medicines that reduce swelling (steroids).  In some cases, you may need to have a procedure to drain the fluid in your eardrum (myringotomy). In this procedure, a small tube is placed in the eardrum to:  Drain the fluid.  Restore the air in the middle ear space.  Follow these instructions at home:  Take over-the-counter and prescription medicines only as told by your health care provider.  Use techniques to help pop your ears as recommended by your health care provider. These may include: ? Chewing gum. ? Yawning. ? Frequent, forceful swallowing. ? Closing your mouth, holding your nose closed, and gently blowing as if you are trying to blow air out of your nose.  Do not do any of the following until your health care provider approves: ? Travel to high altitudes. ? Fly in airplanes. ? Work in a Estate agent or room. ? Scuba dive.  Keep your ears dry. Dry your ears completely after showering or bathing.  Do not smoke.  Keep all follow-up visits as told by your health care provider. This is important. Contact a health care provider if:  Your symptoms do not go away after treatment.  Your symptoms come back after treatment.  You are unable to pop your ears.  You have: ? A fever. ? Pain in  your ear. ? Pain in your head or neck. ? Fluid draining from your ear.  Your hearing suddenly changes.  You become very dizzy.  You lose your balance. This information is not intended to replace advice given to you by your health care provider. Make sure you discuss any questions you have with your health care provider. Document Released: 11/13/2015 Document Revised: 03/24/2016 Document Reviewed: 11/05/2014 Elsevier Interactive Patient Education  2018 [employer] Amoxicillin; Clavulanic Acid tablets What is this medicine? AMOXICILLIN; CLAVULANIC ACID (a mox i SIL in; KLAV yoo lan ic AS id) is a penicillin antibiotic. It is used to treat certain kinds of bacterial infections. It will not work for colds, flu, or other viral infections. This medicine may be used for other purposes; ask your health care provider or pharmacist if you have questions. COMMON BRAND NAME(S): Augmentin What should I tell my health care provider before I take this medicine? They need to know if you have  any of these conditions: -bowel disease, like colitis -kidney disease -liver disease -mononucleosis -an unusual or allergic reaction to amoxicillin, penicillin, cephalosporin, other antibiotics, clavulanic acid, other medicines, foods, dyes, or preservatives -pregnant or trying to get pregnant -breast-feeding How should I use this medicine? Take this medicine by mouth with a full glass of water. Follow the directions on the prescription label. Take at the start of a meal. Do not crush or chew. If the tablet has a score line, you may cut it in half at the score line for easier swallowing. Take your medicine at regular intervals. Do not take your medicine more often than directed. Take all of your medicine as directed even if you think you are better. Do not skip doses or stop your medicine early. Talk to your pediatrician regarding the use of this medicine in children. Special care may be needed. Overdosage: If  you think you have taken too much of this medicine contact a poison control center or emergency room at once. NOTE: This medicine is only for you. Do not share this medicine with others. What if I miss a dose? If you miss a dose, take it as soon as you can. If it is almost time for your next dose, take only that dose. Do not take double or extra doses. What may interact with this medicine? -allopurinol -anticoagulants -birth control pills -methotrexate -probenecid This list may not describe all possible interactions. Give your health care provider a list of all the medicines, herbs, non-prescription drugs, or dietary supplements you use. Also tell them if you smoke, drink alcohol, or use illegal drugs. Some items may interact with your medicine. What should I watch for while using this medicine? Tell your doctor or health care professional if your symptoms do not improve. Do not treat diarrhea with over the counter products. Contact your doctor if you have diarrhea that lasts more than 2 days or if it is severe and watery. If you have diabetes, you may get a false-positive result for sugar in your urine. Check with your doctor or health care professional. Birth control pills may not work properly while you are taking this medicine. Talk to your doctor about using an extra method of birth control. What side effects may I notice from receiving this medicine? Side effects that you should report to your doctor or health care professional as soon as possible: -allergic reactions like skin rash, itching or hives, swelling of the face, lips, or tongue -breathing problems -dark urine -fever or chills, sore throat -redness, blistering, peeling or loosening of the skin, including inside the mouth -seizures -trouble passing urine or change in the amount of urine -unusual bleeding, bruising -unusually weak or tired -white patches or sores in the mouth or throat Side effects that usually do not  require medical attention (report to your doctor or health care professional if they continue or are bothersome): -diarrhea -dizziness -headache -nausea, vomiting -stomach upset -vaginal or anal irritation This list may not describe all possible side effects. Call your doctor for medical advice about side effects. You may report side effects to FDA at 1-800-FDA-1088. Where should I keep my medicine? Keep out of the reach of children. Store at room temperature below 25 degrees C (77 degrees F). Keep container tightly closed. Throw away any unused medicine after the expiration date. NOTE: This sheet is a summary. It may not cover all possible information. If you have questions about this medicine, talk to your doctor, pharmacist, or health care  provider.  2018 Elsevier/Gold Standard (2008-01-10 12:04:30) Sinusitis, Adult Sinusitis is soreness and inflammation of your sinuses. Sinuses are hollow spaces in the bones around your face. They are located:  Around your eyes.  In the middle of your forehead.  Behind your nose.  In your cheekbones.  Your sinuses and nasal passages are lined with a stringy fluid (mucus). Mucus normally drains out of your sinuses. When your nasal tissues get inflamed or swollen, the mucus can get trapped or blocked so air cannot flow through your sinuses. This lets bacteria, viruses, and funguses grow, and that leads to infection. Follow these instructions at home: Medicines  Take, use, or apply over-the-counter and prescription medicines only as told by your doctor. These may include nasal sprays.  If you were prescribed an antibiotic medicine, take it as told by your doctor. Do not stop taking the antibiotic even if you start to feel better. Hydrate and Humidify  Drink enough water to keep your pee (urine) clear or pale yellow.  Use a cool mist humidifier to keep the humidity level in your home above 50%.  Breathe in steam for 10-15 minutes, 3-4 times a  day or as told by your doctor. You can do this in the bathroom while a hot shower is running.  Try not to spend time in cool or dry air. Rest  Rest as much as possible.  Sleep with your head raised (elevated).  Make sure to get enough sleep each night. General instructions  Put a warm, moist washcloth on your face 3-4 times a day or as told by your doctor. This will help with discomfort.  Wash your hands often with soap and water. If there is no soap and water, use hand sanitizer.  Do not smoke. Avoid being around people who are smoking (secondhand smoke).  Keep all follow-up visits as told by your doctor. This is important. Contact a doctor if:  You have a fever.  Your symptoms get worse.  Your symptoms do not get better within 10 days. Get help right away if:  You have a very bad headache.  You cannot stop throwing up (vomiting).  You have pain or swelling around your face or eyes.  You have trouble seeing.  You feel confused.  Your neck is stiff.  You have trouble breathing. This information is not intended to replace advice given to you by your health care provider. Make sure you discuss any questions you have with your health care provider. Document Released: 04/04/2008 Document Revised: 06/12/2016 Document Reviewed: 08/12/2015 Elsevier Interactive Patient Education  [employer].

## 2018-08-14 NOTE — Progress Notes (Signed)
   Subjective:    Patient ID: Carol Davis, female    DOB: [birth year], 62 y.o.   MRN: [id number]  HPI 62 yo female in non  Acute distress. Presents with complaints of  Cough productive clear mild yellow.. Nasal congestion and discharge mild yellow.  Denies fever and  But had chills last night. No chest pain or shortness of breath.   Took  Ibuprofen and Sudafed this morning  Blood pressure 118/79, pulse 71, temperature 97.8 F (36.6 C), temperature source Tympanic, resp. rate 18, weight 210 lb 12.8 oz (95.6 kg), SpO2 99 %. Review of Systems  Constitutional: Positive for chills. Negative for fatigue and fever.  HENT: Positive for congestion. Negative for ear pain and sore throat (2 days ago resolved now).   Eyes: Negative for discharge and itching.  Respiratory: Positive for cough and wheezing. Negative for chest tightness, shortness of breath and stridor.   Cardiovascular: Negative for chest pain, palpitations and leg swelling.  Gastrointestinal: Negative for abdominal pain.  Endocrine: Negative for polydipsia, polyphagia and polyuria.  Genitourinary: Negative for dysuria.  Musculoskeletal: Negative for myalgias.  Skin: Negative for rash.  Allergic/Immunologic: Positive for environmental allergies. Negative for food allergies.  Neurological: Positive for headaches (crown of headache and coughing). Negative for dizziness and syncope.  Psychiatric/Behavioral: Negative for behavioral problems, confusion and suicidal ideas. The patient is not nervous/anxious.        Objective:   Physical Exam  Constitutional: She is oriented to person, place, and time. She appears well-developed and well-nourished.  HENT:  Head: Normocephalic and atraumatic.  Right Ear: Hearing, external ear and ear canal normal. A middle ear effusion is present.  Left Ear: Hearing, external ear and ear canal normal. A middle ear effusion is present.  Nose: Mucosal edema and rhinorrhea present.  Mouth/Throat: Uvula  is midline, oropharynx is clear and moist and mucous membranes are normal. Tonsils are 1+ on the right. Tonsils are 1+ on the left.  Eyes: Pupils are equal, round, and reactive to light. Conjunctivae and EOM are normal.  Neck: Normal range of motion. Neck supple.  Cardiovascular: Normal rate, regular rhythm and normal heart sounds.  Pulmonary/Chest: Effort normal and breath sounds normal.  Neurological: She is oriented to person, place, and time.  Skin: Skin is warm and dry.  Psychiatric: She has a normal mood and affect. Her behavior is normal. Judgment and thought content normal.  Nursing note and vitals reviewed.     Coughing in room.    Assessment & Plan:  Sinusitis and URI Declines Albuterol MDI Meds ordered this encounter  Medications  . benzonatate (TESSALON PERLES) 100 MG capsule    Sig: Take 1 capsule (100 mg total) by mouth 3 (three) times daily as needed.    Dispense:  30 capsule    Refill:  0  . amoxicillin-clavulanate (AUGMENTIN) 875-125 MG tablet    Sig: Take 1 tablet by mouth 2 (two) times daily. Take with food    Dispense:  20 tablet    Refill:  0  Rest , increase fluids, continue Motrin and Sudafed as prescribed on package..  Try OTC Claritin and Flonase, take as directed..  Return in 3 - 5 days if not improving.  Patient verbalizes understanding and has no questions at discharge.

## 2018-08-31 DIAGNOSIS — E038 Other specified hypothyroidism: Secondary | ICD-10-CM | POA: Diagnosis not present

## 2018-08-31 DIAGNOSIS — E063 Autoimmune thyroiditis: Secondary | ICD-10-CM | POA: Diagnosis not present

## 2018-09-19 ENCOUNTER — Other Ambulatory Visit: Payer: Self-pay

## 2018-09-19 DIAGNOSIS — E038 Other specified hypothyroidism: Principal | ICD-10-CM

## 2018-09-20 ENCOUNTER — Other Ambulatory Visit: Payer: BLUE CROSS/BLUE SHIELD

## 2018-09-20 DIAGNOSIS — E038 Other specified hypothyroidism: Principal | ICD-10-CM

## 2018-09-21 LAB — TSH: TSH: 5.96 u[IU]/mL — AB (ref 0.450–4.500)

## 2018-12-01 IMAGING — CR DG CHEST 2V
2 series · 2 of 2 positions shown · non-contrast
Comparison: None.

CLINICAL DATA: Productive cough for 3 years. Chest pressure. Cough
for 3 weeks.

EXAM:
CHEST  2 VIEW

[chest pa]
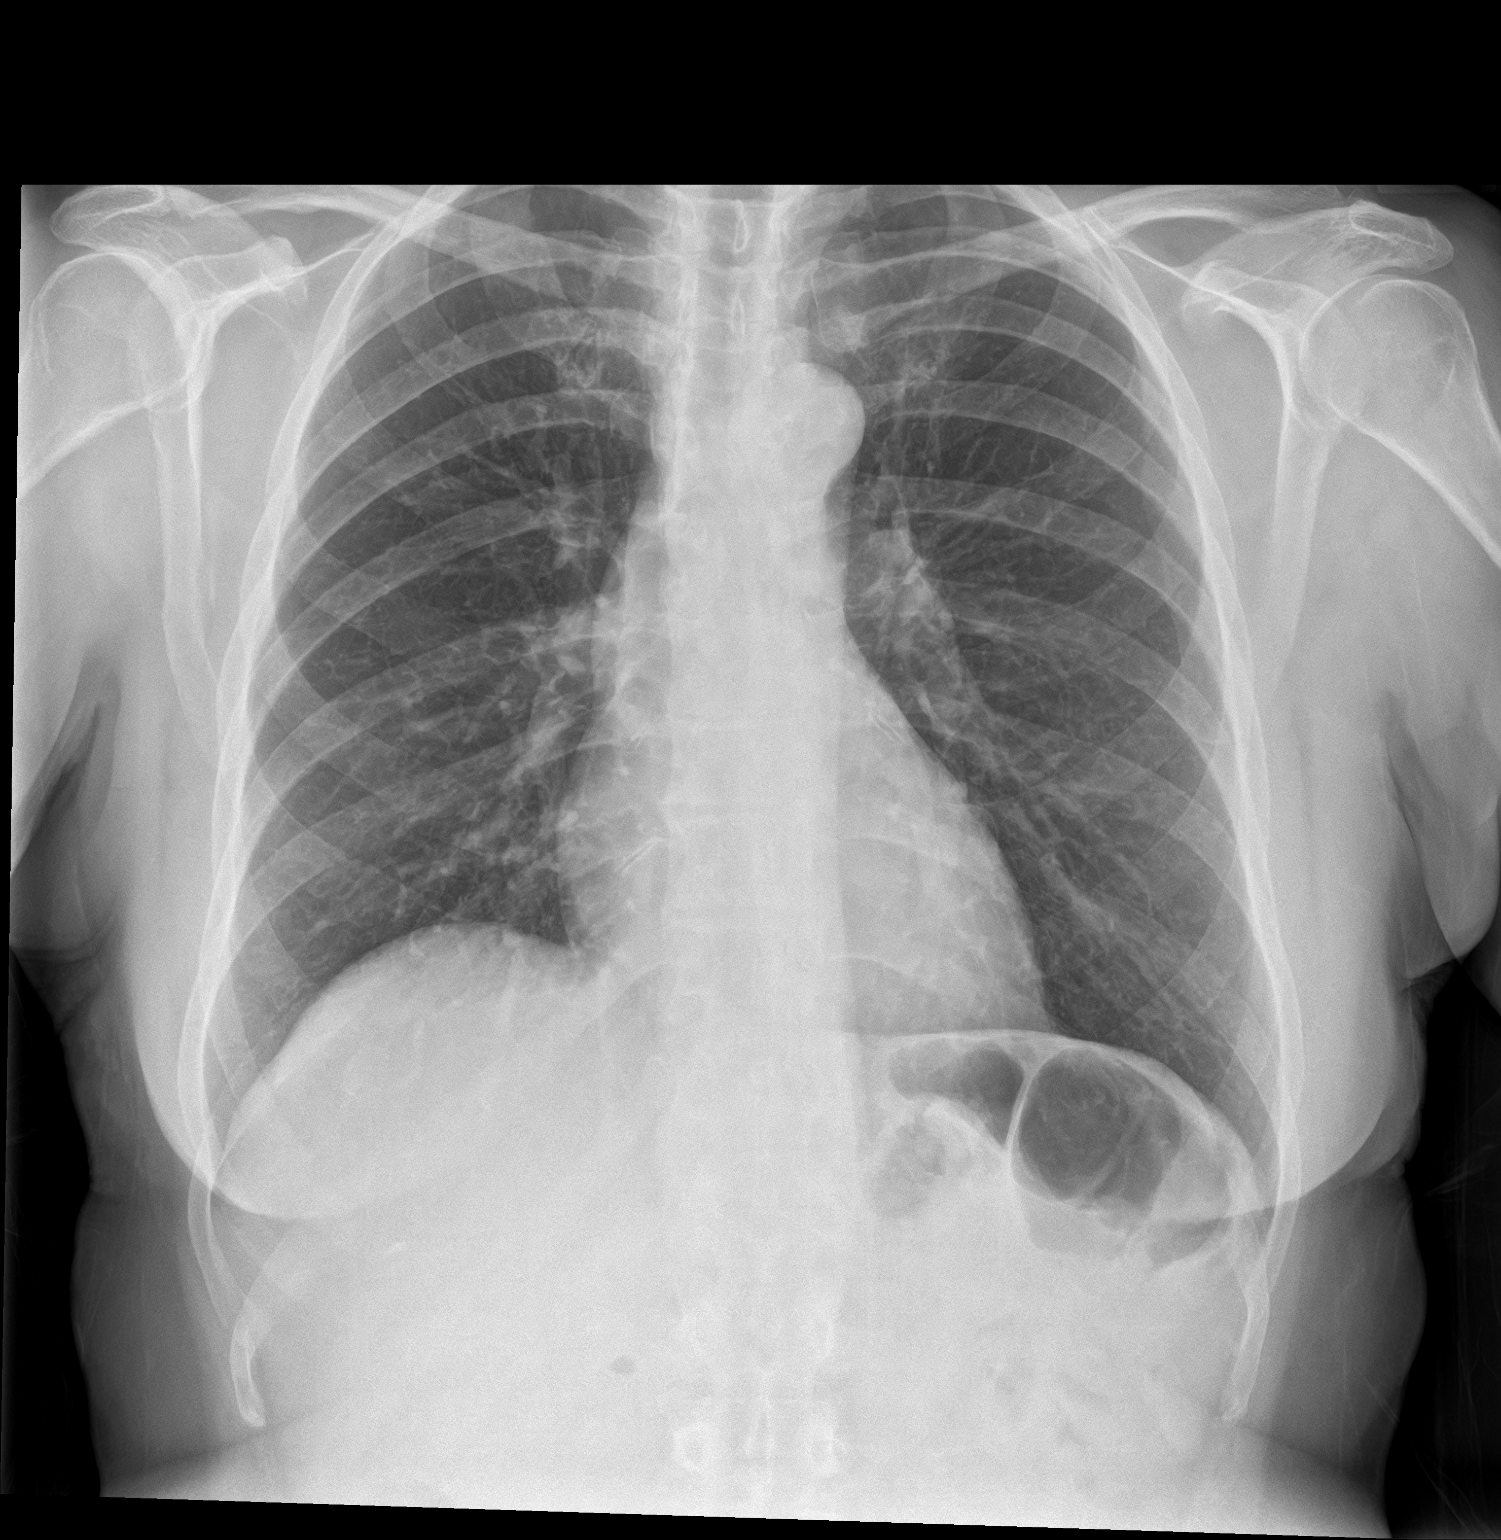

[chest lat]
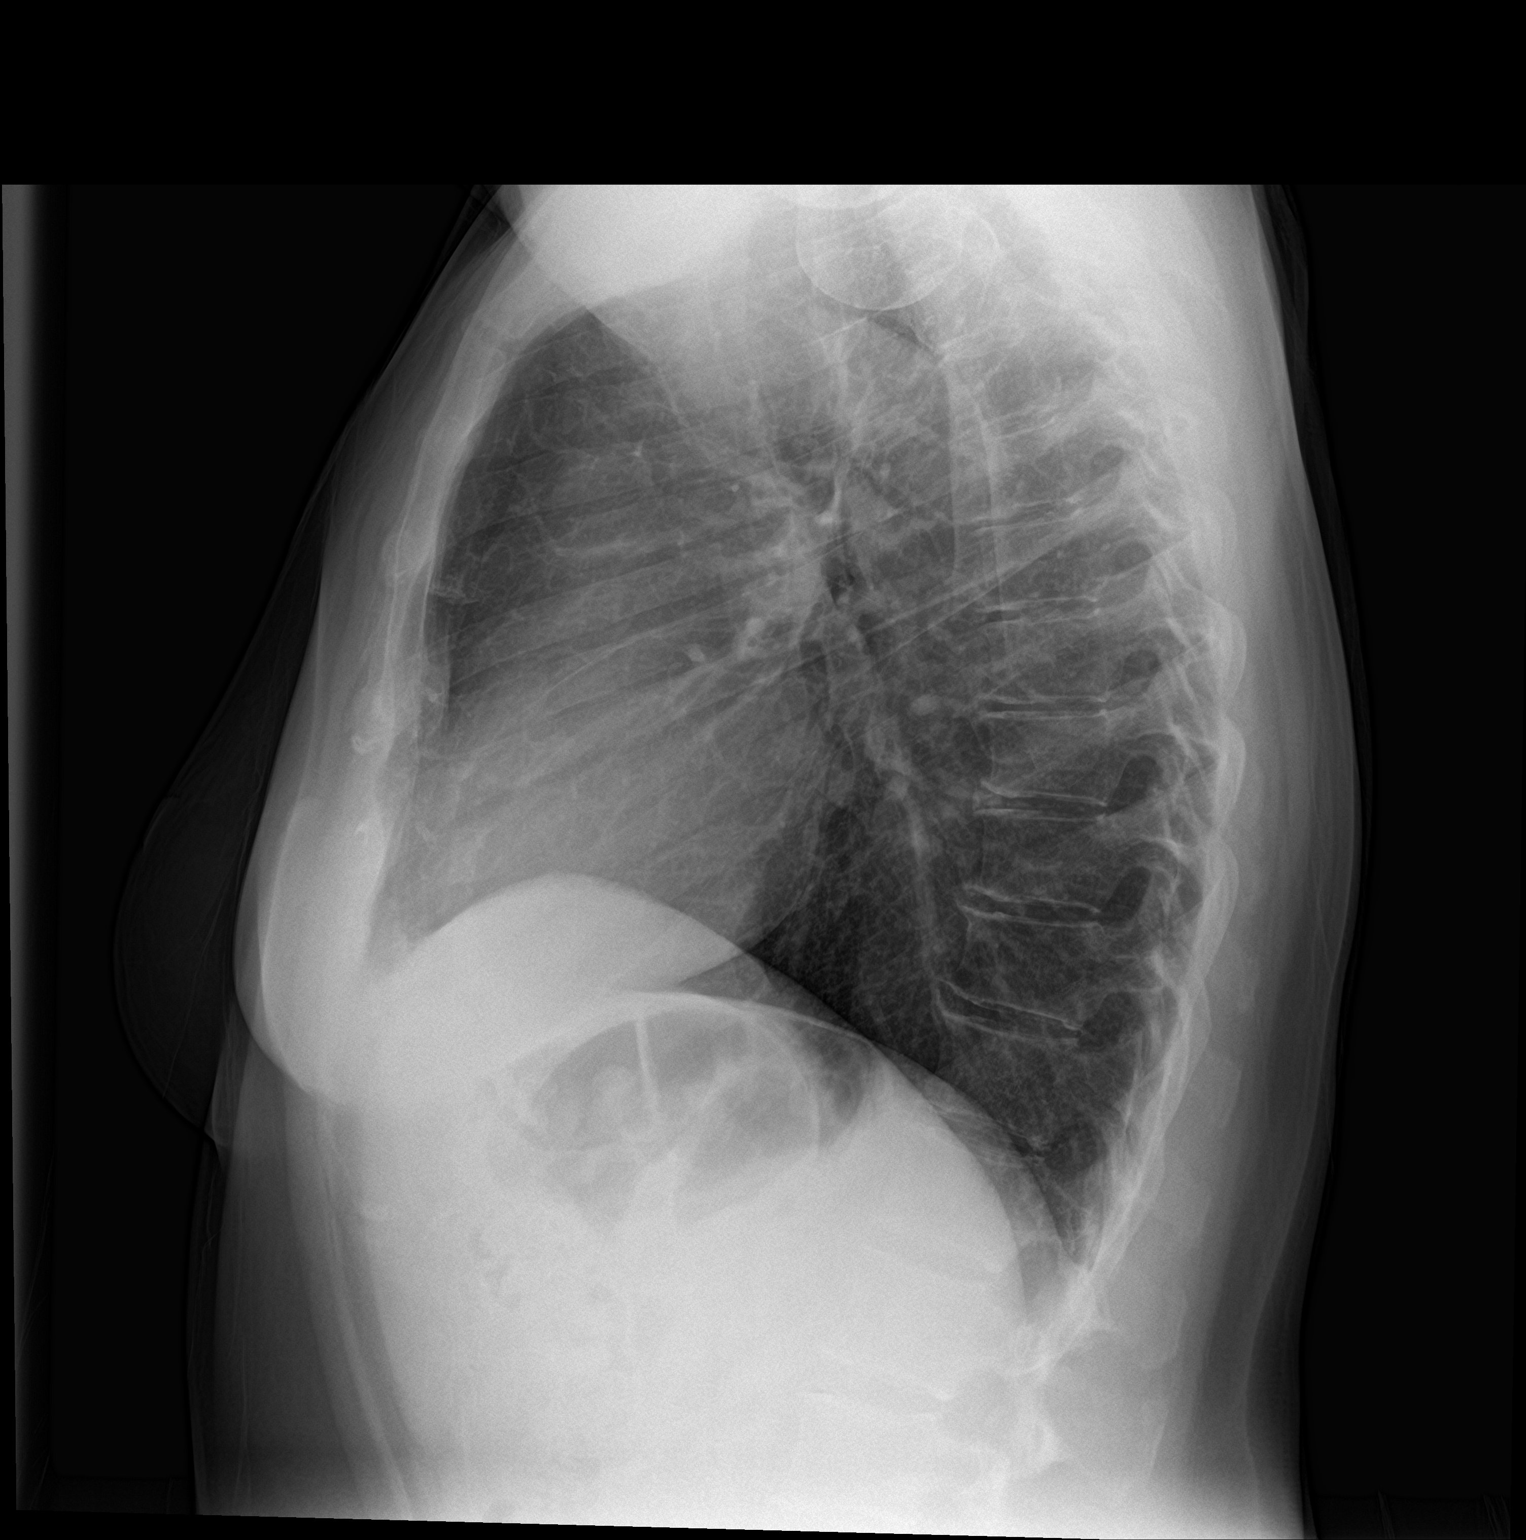

[2 of 2 positions shown; findings below may reference images not displayed]

FINDINGS: The heart size and mediastinal contours are within normal limits.
Both lungs are clear. The visualized skeletal structures are
unremarkable.
IMPRESSION: No active cardiopulmonary disease.

## 2018-12-04 ENCOUNTER — Other Ambulatory Visit: Payer: BLUE CROSS/BLUE SHIELD

## 2018-12-04 DIAGNOSIS — E039 Hypothyroidism, unspecified: Secondary | ICD-10-CM

## 2018-12-04 DIAGNOSIS — E038 Other specified hypothyroidism: Secondary | ICD-10-CM

## 2018-12-05 LAB — TSH: TSH: 3.22 u[IU]/mL (ref 0.450–4.500)

## 2018-12-06 DIAGNOSIS — E063 Autoimmune thyroiditis: Secondary | ICD-10-CM | POA: Diagnosis not present

## 2018-12-06 DIAGNOSIS — E038 Other specified hypothyroidism: Secondary | ICD-10-CM | POA: Diagnosis not present

## 2019-04-10 DIAGNOSIS — M170 Bilateral primary osteoarthritis of knee: Secondary | ICD-10-CM | POA: Diagnosis not present

## 2019-04-10 DIAGNOSIS — M65331 Trigger finger, right middle finger: Secondary | ICD-10-CM | POA: Diagnosis not present

## 2019-04-23 DIAGNOSIS — E038 Other specified hypothyroidism: Secondary | ICD-10-CM | POA: Diagnosis not present

## 2019-04-23 DIAGNOSIS — E063 Autoimmune thyroiditis: Secondary | ICD-10-CM | POA: Diagnosis not present

## 2019-04-23 DIAGNOSIS — E669 Obesity, unspecified: Secondary | ICD-10-CM | POA: Diagnosis not present

## 2019-05-21 DIAGNOSIS — E669 Obesity, unspecified: Secondary | ICD-10-CM | POA: Diagnosis not present

## 2019-05-21 DIAGNOSIS — E038 Other specified hypothyroidism: Secondary | ICD-10-CM | POA: Diagnosis not present

## 2019-05-21 DIAGNOSIS — E063 Autoimmune thyroiditis: Secondary | ICD-10-CM | POA: Diagnosis not present

## 2019-05-21 DIAGNOSIS — E039 Hypothyroidism, unspecified: Secondary | ICD-10-CM | POA: Diagnosis not present

## 2019-06-24 DIAGNOSIS — E669 Obesity, unspecified: Secondary | ICD-10-CM | POA: Diagnosis not present

## 2019-06-24 DIAGNOSIS — E063 Autoimmune thyroiditis: Secondary | ICD-10-CM | POA: Diagnosis not present

## 2019-06-24 DIAGNOSIS — E038 Other specified hypothyroidism: Secondary | ICD-10-CM | POA: Diagnosis not present

## 2019-07-10 ENCOUNTER — Encounter: Payer: BLUE CROSS/BLUE SHIELD | Admitting: Obstetrics and Gynecology

## 2019-07-11 ENCOUNTER — Other Ambulatory Visit: Payer: Self-pay

## 2019-07-11 ENCOUNTER — Encounter: Payer: Self-pay | Admitting: Obstetrics and Gynecology

## 2019-07-11 ENCOUNTER — Ambulatory Visit (INDEPENDENT_AMBULATORY_CARE_PROVIDER_SITE_OTHER): Payer: BC Managed Care – PPO | Admitting: Obstetrics and Gynecology

## 2019-07-11 VITALS — BP 131/76 | HR 84 | Ht 67.0 in | Wt 216.7 lb

## 2019-07-11 DIAGNOSIS — Z13220 Encounter for screening for lipoid disorders: Secondary | ICD-10-CM

## 2019-07-11 DIAGNOSIS — Z01419 Encounter for gynecological examination (general) (routine) without abnormal findings: Secondary | ICD-10-CM | POA: Diagnosis not present

## 2019-07-11 DIAGNOSIS — Z23 Encounter for immunization: Secondary | ICD-10-CM | POA: Diagnosis not present

## 2019-07-11 DIAGNOSIS — N952 Postmenopausal atrophic vaginitis: Secondary | ICD-10-CM

## 2019-07-11 DIAGNOSIS — W57XXXA Bitten or stung by nonvenomous insect and other nonvenomous arthropods, initial encounter: Secondary | ICD-10-CM

## 2019-07-11 DIAGNOSIS — Z1239 Encounter for other screening for malignant neoplasm of breast: Secondary | ICD-10-CM

## 2019-07-11 MED ORDER — HYDROCORTISONE 2.5 % EX CREA: TOPICAL_CREAM | Freq: Two times a day (BID) | CUTANEOUS | 1 refills | Status: DC | PRN

## 2019-07-11 NOTE — Progress Notes (Signed)
Pt is present for annual exam. Pt stated that she was doing well. Denies any gyn issues at this time. Pt had flu vaccine today.

## 2019-07-11 NOTE — Patient Instructions (Signed)
Preventive Care 40-62 Years Old, Female Preventive care refers to visits with your health care provider and lifestyle choices that can promote health and wellness. This includes:  A yearly physical exam. This may also be called an annual well check.  Regular dental visits and eye exams.  Immunizations.  Screening for certain conditions.  Healthy lifestyle choices, such as eating a healthy diet, getting regular exercise, not using drugs or products that contain nicotine and tobacco, and limiting alcohol use. What can I expect for my preventive care visit? Physical exam Your health care provider will check your:  Height and weight. This may be used to calculate body mass index (BMI), which tells if you are at a healthy weight.  Heart rate and blood pressure.  Skin for abnormal spots. Counseling Your health care provider may ask you questions about your:  Alcohol, tobacco, and drug use.  Emotional well-being.  Home and relationship well-being.  Sexual activity.  Eating habits.  Work and work environment.  Method of birth control.  Menstrual cycle.  Pregnancy history. What immunizations do I need?  Influenza (flu) vaccine  This is recommended every year. Tetanus, diphtheria, and pertussis (Tdap) vaccine  You may need a Td booster every 10 years. Varicella (chickenpox) vaccine  You may need this if you have not been vaccinated. Zoster (shingles) vaccine  You may need this after age 60. Measles, mumps, and rubella (MMR) vaccine  You may need at least one dose of MMR if you were born in 1957 or later. You may also need a second dose. Pneumococcal conjugate (PCV13) vaccine  You may need this if you have certain conditions and were not previously vaccinated. Pneumococcal polysaccharide (PPSV23) vaccine  You may need one or two doses if you smoke cigarettes or if you have certain conditions. Meningococcal conjugate (MenACWY) vaccine  You may need this if you  have certain conditions. Hepatitis A vaccine  You may need this if you have certain conditions or if you travel or work in places where you may be exposed to hepatitis A. Hepatitis B vaccine  You may need this if you have certain conditions or if you travel or work in places where you may be exposed to hepatitis B. Haemophilus influenzae type b (Hib) vaccine  You may need this if you have certain conditions. Human papillomavirus (HPV) vaccine  If recommended by your health care provider, you may need three doses over 6 months. You may receive vaccines as individual doses or as more than one vaccine together in one shot (combination vaccines). Talk with your health care provider about the risks and benefits of combination vaccines. What tests do I need? Blood tests  Lipid and cholesterol levels. These may be checked every 5 years, or more frequently if you are over 50 years old.  Hepatitis C test.  Hepatitis B test. Screening  Lung cancer screening. You may have this screening every year starting at age 55 if you have a 30-pack-year history of smoking and currently smoke or have quit within the past 15 years.  Colorectal cancer screening. All adults should have this screening starting at age 50 and continuing until age 75. Your health care provider may recommend screening at age 45 if you are at increased risk. You will have tests every 1-10 years, depending on your results and the type of screening test.  Diabetes screening. This is done by checking your blood sugar (glucose) after you have not eaten for a while (fasting). You may have this   done every 1-3 years.  Mammogram. This may be done every 1-2 years. Talk with your health care provider about when you should start having regular mammograms. This may depend on whether you have a family history of breast cancer.  BRCA-related cancer screening. This may be done if you have a family history of breast, ovarian, tubal, or peritoneal  cancers.  Pelvic exam and Pap test. This may be done every 3 years starting at age 23. Starting at age 86, this may be done every 5 years if you have a Pap test in combination with an HPV test. Other tests  Sexually transmitted disease (STD) testing.  Bone density scan. This is done to screen for osteoporosis. You may have this scan if you are at high risk for osteoporosis. Follow these instructions at home: Eating and drinking  Eat a diet that includes fresh fruits and vegetables, whole grains, lean protein, and low-fat dairy.  Take vitamin and mineral supplements as recommended by your health care provider.  Do not drink alcohol if: ? Your health care provider tells you not to drink. ? You are pregnant, may be pregnant, or are planning to become pregnant.  If you drink alcohol: ? Limit how much you have to 0-1 drink a day. ? Be aware of how much alcohol is in your drink. In the [place] one drink equals one 12 oz bottle of beer (355 mL), one 5 oz glass of wine (148 mL), or one 1 oz glass of hard liquor (44 mL). Lifestyle  Take daily care of your teeth and gums.  Stay active. Exercise for at least 30 minutes on 5 or more days each week.  Do not use any products that contain nicotine or tobacco, such as cigarettes, e-cigarettes, and chewing tobacco. If you need help quitting, ask your health care provider.  If you are sexually active, practice safe sex. Use a condom or other form of birth control (contraception) in order to prevent pregnancy and STIs (sexually transmitted infections).  If told by your health care provider, take low-dose aspirin daily starting at age 27. What's next?  Visit your health care provider once a year for a well check visit.  Ask your health care provider how often you should have your eyes and teeth checked.  Stay up to date on all vaccines. This information is not intended to replace advice given to you by your health care provider. Make sure you  discuss any questions you have with your health care provider. Document Released: 11/13/2015 Document Revised: 06/28/2018 Document Reviewed: 06/28/2018 Elsevier Patient Education  2020 [place] self-awareness is knowing how your breasts look and feel. Doing breast self-awareness is important. It allows you to catch a breast problem early while it is still small and can be treated. All women should do breast self-awareness, including women who have had breast implants. Tell your doctor if you notice a change in your breasts. What you need:  A mirror.  A well-lit room. How to do a breast self-exam A breast self-exam is one way to learn what is normal for your breasts and to check for changes. To do a breast self-exam: Look for changes  1. Take off all the clothes above your waist. 2. Stand in front of a mirror in a room with good lighting. 3. Put your hands on your hips. 4. Push your hands down. 5. Look at your breasts and nipples in the mirror to see if one breast or nipple looks  different from the other. Check to see if: ? The shape of one breast is different. ? The size of one breast is different. ? There are wrinkles, dips, and bumps in one breast and not the other. 6. Look at each breast for changes in the skin, such as: ? Redness. ? Scaly areas. 7. Look for changes in your nipples, such as: ? Liquid around the nipples. ? Bleeding. ? Dimpling. ? Redness. ? A change in where the nipples are. Feel for changes  1. Lie on your back on the floor. 2. Feel each breast. To do this, follow these steps: ? Pick a breast to feel. ? Put the arm closest to that breast above your head. ? Use your other arm to feel the nipple area of your breast. Feel the area with the pads of your three middle fingers by making small circles with your fingers. For the first circle, press lightly. For the second circle, press harder. For the third circle, press even harder.  ? Keep making circles with your fingers at the different pressures as you move down your breast. Stop when you feel your ribs. ? Move your fingers a little toward the center of your body. ? Start making circles with your fingers again, this time going up until you reach your collarbone. ? Keep making up-and-down circles until you reach your armpit. Remember to keep using the three pressures. ? Feel the other breast in the same way. 3. Sit or stand in the tub or shower. 4. With soapy water on your skin, feel each breast the same way you did in step 2 when you were lying on the floor. Write down what you find Writing down what you find can help you remember what to tell your doctor. Write down:  What is normal for each breast.  Any changes you find in each breast, including: ? The kind of changes you find. ? Whether you have pain. ? Size and location of any lumps.  When you last had your menstrual period. General tips  Check your breasts every month.  If you are breastfeeding, the best time to check your breasts is after you feed your baby or after you use a breast pump.  If you get menstrual periods, the best time to check your breasts is 5-7 days after your menstrual period is over.  With time, you will become comfortable with the self-exam, and you will begin to know if there are changes in your breasts. Contact a doctor if you:  See a change in the shape or size of your breasts or nipples.  See a change in the skin of your breast or nipples, such as red or scaly skin.  Have fluid coming from your nipples that is not normal.  Find a lump or thick area that was not there before.  Have pain in your breasts.  Have any concerns about your breast health. Summary  Breast self-awareness includes looking for changes in your breasts, as well as feeling for changes within your breasts.  Breast self-awareness should be done in front of a mirror in a well-lit room.  You should  check your breasts every month. If you get menstrual periods, the best time to check your breasts is 5-7 days after your menstrual period is over.  Let your doctor know of any changes you see in your breasts, including changes in size, changes on the skin, pain or tenderness, or fluid from your nipples that is not normal. This  information is not intended to replace advice given to you by your health care provider. Make sure you discuss any questions you have with your health care provider. Document Released: 04/04/2008 Document Revised: 06/05/2018 Document Reviewed: 06/05/2018 Elsevier Patient Education  2020 [employer].

## 2019-07-11 NOTE — Progress Notes (Signed)
ANNUAL PREVENTATIVE CARE GYNECOLOGY  ENCOUNTER NOTE  Subjective:       Carol Davis is a 63 y.o. G0P0 female here for a routine annual gynecologic exam. The patient is not sexually active. The patient is not taking hormone replacement therapy. Patient denies post-menopausal vaginal bleeding. The patient wears seatbelts: yes. The patient participates in regular exercise: yes (2-3 days per week). Has the patient ever been transfused or tattooed?: no. The patient reports that there is not domestic violence in her life.  Current complaints: 1.  Patient desires refill of her hydrocortisone cream.  Notes that she is constantly susceptible to mosquito bites even with short intervals outside (10 min or less). Notes she does not like to use insect repellant often due to leaving her skin very oily.    Gynecologic History No LMP recorded. Patient is postmenopausal. Contraception: post menopausal status Last Pap: 2018 neg/neg/neg . Results were: normal Last mammogram:  07/20/2018.  Results were: normal.  Last colonoscopy:  05/2018. Results were: colon polyps.  Next due in 5 years.  Last Dexa scan: patient has never had one.    Obstetric History OB History  Gravida Para Term Preterm AB Living  0            SAB TAB Ectopic Multiple Live Births               Past Medical History:  Diagnosis Date  . Anemia    IDA  . HSV-2 (herpes simplex virus 2) infection   . Leucopenia   . STD (sexually transmitted disease)    pos gonorrhea  . Thyroid disease     Family History  Problem Relation Age of Onset  . Diabetes Mother   . Diabetes Father   . Diabetes Brother   . Breast cancer Neg Hx   . Ovarian cancer Neg Hx   . Colon cancer Neg Hx   . Heart disease Neg Hx     Past Surgical History:  Procedure Laterality Date  . ANUS SURGERY  6-10 years ago  . AUGMENTATION MAMMAPLASTY  10/2016   removed   . BREAST IMPLANT REMOVAL Bilateral   . COLONOSCOPY  within last 3 years  . TOTAL HIP  ARTHROPLASTY Left     Social History   Socioeconomic History  . Marital status: Single    Spouse name: Not on file  . Number of children: Not on file  . Years of education: Not on file  . Highest education level: Not on file  Occupational History  . Not on file  Social Needs  . Financial resource strain: Not on file  . Food insecurity    Worry: Not on file    Inability: Not on file  . Transportation needs    Medical: Not on file    Non-medical: Not on file  Tobacco Use  . Smoking status: Former Smoker    Quit date: 02/29/1988    Years since quitting: 31.3  . Smokeless tobacco: Never Used  . Tobacco comment: quit 30 years ago  Substance and Sexual Activity  . Alcohol use: No  . Drug use: No  . Sexual activity: Not Currently    Birth control/protection: Post-menopausal  Lifestyle  . Physical activity    Days per week: 2 days    Minutes per session: 60 min  . Stress: Not on file  Relationships  . Social Herbalist on phone: Not on file    Gets together: Not on file  Attends religious service: Not on file    Active member of club or organization: Not on file    Attends meetings of clubs or organizations: Not on file    Relationship status: Not on file  . Intimate partner violence    Fear of current or ex partner: Not on file    Emotionally abused: Not on file    Physically abused: Not on file    Forced sexual activity: Not on file  Other Topics Concern  . Not on file  Social History Narrative  . Not on file    Current Outpatient Medications on File Prior to Visit  Medication Sig Dispense Refill  . ibuprofen (ADVIL,MOTRIN) 400 MG tablet Take 400 mg by mouth every 6 (six) hours as needed.    Marland Kitchen. levothyroxine (LEVOXYL) 100 MCG tablet Take 100 mcg by mouth once.    . pseudoephedrine (SUDAFED) 30 MG tablet Take 30 mg by mouth every 4 (four) hours as needed for congestion.     No current facility-administered medications on file prior to visit.     No  Known Allergies    Review of Systems ROS Review of Systems - General ROS: negative for - chills, fatigue, fever, hot flashes, night sweats, weight gain.  Positive weight loss (25 lbs since May, currently on Phentermine).  Psychological ROS: negative for - anxiety, decreased libido, depression, mood swings, physical abuse or sexual abuse Ophthalmic ROS: negative for - blurry vision, eye pain or loss of vision ENT ROS: negative for - headaches, hearing change, visual changes or vocal changes Allergy and Immunology ROS: negative for - hives, itchy/watery eyes or seasonal allergies Hematological and Lymphatic ROS: negative for - bleeding problems, bruising, swollen lymph nodes or weight loss Endocrine ROS: negative for - galactorrhea, hair pattern changes, hot flashes, malaise/lethargy, mood swings, palpitations, polydipsia/polyuria, skin changes, temperature intolerance or unexpected weight changes Breast ROS: negative for - new or changing breast lumps or nipple discharge Respiratory ROS: negative for - cough or shortness of breath Cardiovascular ROS: negative for - chest pain, irregular heartbeat, palpitations or shortness of breath Gastrointestinal ROS: no abdominal pain, change in bowel habits, or black or bloody stools Genito-Urinary ROS: no dysuria, trouble voiding, or hematuria Musculoskeletal ROS: negative for - joint pain or joint stiffness Neurological ROS: negative for - bowel and bladder control changes Dermatological ROS: negative for rash and skin lesion changes   Objective:   BP 131/76   Pulse 84   Ht 5\' 7"  (1.702 m)   Wt 216 lb 11.2 oz (98.3 kg)   BMI 33.94 kg/m  CONSTITUTIONAL: Well-developed, well-nourished female in no acute distress.  PSYCHIATRIC: Normal mood and affect. Normal behavior. Normal judgment and thought content. NEUROLGIC: Alert and oriented to person, place, and time. Normal muscle tone coordination. No cranial nerve deficit noted. HENT:  Normocephalic,  atraumatic, External right and left ear normal. Oropharynx is clear and moist EYES: Conjunctivae and EOM are normal. Pupils are equal, round, and reactive to light. No scleral icterus.  NECK: Normal range of motion, supple, no masses.  Normal thyroid.  SKIN: Skin is warm and dry. No rash noted. Not diaphoretic. No erythema. No pallor. CARDIOVASCULAR: Normal heart rate noted, regular rhythm, no murmur. RESPIRATORY: Clear to auscultation bilaterally. Effort and breath sounds normal, no problems with respiration noted. BREASTS: Symmetric in size. No masses, skin changes, nipple drainage, or lymphadenopathy. ABDOMEN: Soft, normal bowel sounds, no distention noted.  No tenderness, rebound or guarding.  BLADDER: Normal PELVIC:  Bladder no  bladder distension noted  Urethra: normal appearing urethra with no masses, tenderness or lesions  Vulva: normal appearing vulva with no masses, tenderness or lesions  Vagina: atrophic (moderate), no discharge or lesions  Cervix: normal appearing cervix without discharge or lesions  Uterus: uterus is normal size, shape, consistency and nontender  Adnexa: normal adnexa in size, nontender and no masses  RV: External Exam NormaI, No Rectal Masses and Normal Sphincter tone  MUSCULOSKELETAL: Normal range of motion. No tenderness.  No cyanosis, clubbing, or edema.  2+ distal pulses. LYMPHATIC: No Axillary, Supraclavicular, or Inguinal Adenopathy.   Labs:  Other labs reviewed in Care Everywhere, performed 05/21/2019.  Lab Results  Component Value Date   WBC 3.2 (L) 02/27/2018   HGB 13.3 02/27/2018   HCT 41.0 02/27/2018   MCV 92 02/27/2018   PLT 216 02/27/2018    Lab Results  Component Value Date   CHOL 192 07/06/2018   HDL 81 07/06/2018   LDLCALC 102 (H) 07/06/2018   TRIG 44 07/06/2018   CHOLHDL 2.4 07/06/2018     Assessment:   Well woman exam with routine gynecological exam Screening for breast cancer Lipid screening - Plan: Lipid panel Flu  vaccine need Vaginal atrophy Mosquito bite, initial encounter Alcoholism, in remission  Plan:  Pap: Not done.  Due in 2021.  Mammogram: Ordered Stool Guaiac Testing:  Not Ordered.  Patient up to date on colonoscopy. Labs: CBC and Lipid 1 Routine preventative health maintenance measures emphasized: Exercise/Diet/Weight control, Tobacco Warnings, Alcohol/Substance use risks, Stress Management, Peer Pressure Issues and Safe Sex Flu vaccine given today.   Mosquito bites, patient notes she is very prone, within 10 minutes of being outside. Discussed other topical agents that can be used. Will also refill steroid cream at patient's request, but advised to use sparingly.  Flu vaccine given today. Return to Clinic - 1 Year   Hildred Laser, MD [facility]

## 2019-08-01 DIAGNOSIS — H903 Sensorineural hearing loss, bilateral: Secondary | ICD-10-CM | POA: Diagnosis not present

## 2019-08-01 DIAGNOSIS — H9313 Tinnitus, bilateral: Secondary | ICD-10-CM | POA: Diagnosis not present

## 2019-08-01 DIAGNOSIS — H6123 Impacted cerumen, bilateral: Secondary | ICD-10-CM | POA: Diagnosis not present

## 2019-08-09 ENCOUNTER — Other Ambulatory Visit: Payer: Self-pay

## 2019-08-09 DIAGNOSIS — Z0000 Encounter for general adult medical examination without abnormal findings: Secondary | ICD-10-CM

## 2019-08-09 DIAGNOSIS — Z13220 Encounter for screening for lipoid disorders: Secondary | ICD-10-CM

## 2019-08-09 DIAGNOSIS — Z01419 Encounter for gynecological examination (general) (routine) without abnormal findings: Secondary | ICD-10-CM

## 2019-08-09 NOTE — Progress Notes (Unsigned)
cbc

## 2019-08-10 LAB — LIPID PANEL WITH LDL/HDL RATIO
Cholesterol, Total: 213 mg/dL — ABNORMAL HIGH (ref 100–199)
HDL: 80 mg/dL (ref >39)
LDL Chol Calc (NIH): 121 mg/dL — ABNORMAL HIGH (ref 0–99)
LDL/HDL Ratio: 1.5 ratio (ref 0.0–3.2)
Triglycerides: 67 mg/dL (ref 0–149)
VLDL Cholesterol Cal: 12 mg/dL (ref 5–40)

## 2019-08-10 LAB — CBC WITH DIFFERENTIAL/PLATELET
Basophils Absolute: 0.1 10*3/uL (ref 0.0–0.2)
Basos: 1 %
EOS (ABSOLUTE): 0.1 10*3/uL (ref 0.0–0.4)
Eos: 3 %
Hematocrit: 38.1 % (ref 34.0–46.6)
Hemoglobin: 12 g/dL (ref 11.1–15.9)
Immature Grans (Abs): 0 10*3/uL (ref 0.0–0.1)
Immature Granulocytes: 0 %
Lymphocytes Absolute: 1.3 10*3/uL (ref 0.7–3.1)
Lymphs: 29 %
MCH: 28 pg (ref 26.6–33.0)
MCHC: 31.5 g/dL (ref 31.5–35.7)
MCV: 89 fL (ref 79–97)
Monocytes Absolute: 0.4 10*3/uL (ref 0.1–0.9)
Monocytes: 9 %
Neutrophils Absolute: 2.7 10*3/uL (ref 1.4–7.0)
Neutrophils: 58 %
Platelets: 256 10*3/uL (ref 150–450)
RBC: 4.28 x10E6/uL (ref 3.77–5.28)
RDW: 13.3 % (ref 11.7–15.4)
WBC: 4.6 10*3/uL (ref 3.4–10.8)

## 2019-08-10 LAB — TSH: TSH: 3.39 u[IU]/mL (ref 0.450–4.500)

## 2019-08-19 DIAGNOSIS — M25542 Pain in joints of left hand: Secondary | ICD-10-CM | POA: Diagnosis not present

## 2019-08-19 DIAGNOSIS — S61432A Puncture wound without foreign body of left hand, initial encounter: Secondary | ICD-10-CM | POA: Diagnosis not present

## 2019-08-19 DIAGNOSIS — Z23 Encounter for immunization: Secondary | ICD-10-CM | POA: Diagnosis not present

## 2019-08-20 ENCOUNTER — Other Ambulatory Visit: Payer: BLUE CROSS/BLUE SHIELD

## 2019-08-21 ENCOUNTER — Ambulatory Visit: Payer: BLUE CROSS/BLUE SHIELD

## 2019-08-23 DIAGNOSIS — E669 Obesity, unspecified: Secondary | ICD-10-CM | POA: Diagnosis not present

## 2019-08-23 DIAGNOSIS — E063 Autoimmune thyroiditis: Secondary | ICD-10-CM | POA: Diagnosis not present

## 2019-08-23 DIAGNOSIS — E038 Other specified hypothyroidism: Secondary | ICD-10-CM | POA: Diagnosis not present

## 2019-10-31 ENCOUNTER — Ambulatory Visit: Payer: BC Managed Care – PPO | Attending: Internal Medicine

## 2019-10-31 DIAGNOSIS — Z20822 Contact with and (suspected) exposure to covid-19: Secondary | ICD-10-CM

## 2019-11-06 LAB — NOVEL CORONAVIRUS, NAA

## 2019-11-07 ENCOUNTER — Ambulatory Visit: Payer: BC Managed Care – PPO | Attending: Internal Medicine

## 2019-11-07 DIAGNOSIS — Z20822 Contact with and (suspected) exposure to covid-19: Secondary | ICD-10-CM

## 2019-11-08 ENCOUNTER — Other Ambulatory Visit: Payer: BC Managed Care – PPO

## 2019-11-09 LAB — NOVEL CORONAVIRUS, NAA: SARS-CoV-2, NAA: NOT DETECTED

## 2020-01-22 ENCOUNTER — Other Ambulatory Visit: Payer: Self-pay

## 2020-01-22 DIAGNOSIS — E065 Other chronic thyroiditis: Secondary | ICD-10-CM

## 2020-01-23 LAB — TSH: TSH: 3.35 u[IU]/mL (ref 0.450–4.500)

## 2020-02-25 ENCOUNTER — Ambulatory Visit: Payer: Self-pay | Admitting: Medical

## 2020-02-25 ENCOUNTER — Encounter: Payer: Self-pay | Admitting: Medical

## 2020-02-25 ENCOUNTER — Other Ambulatory Visit: Payer: Self-pay

## 2020-02-25 VITALS — BP 99/77 | HR 96 | Temp 99.1°F | Resp 18 | Wt 213.2 lb

## 2020-02-25 DIAGNOSIS — S058X2A Other injuries of left eye and orbit, initial encounter: Secondary | ICD-10-CM

## 2020-02-25 MED ORDER — ERYTHROMYCIN 5 MG/GM OP OINT: 1.0000 "application " | TOPICAL_OINTMENT | Freq: Every day | OPHTHALMIC | 0 refills | Status: AC

## 2020-02-25 NOTE — Progress Notes (Addendum)
   Subjective:    Patient ID: Carol Davis, female    DOB: Aug 24, 1956, 64 y.o.   MRN: [id number]  HPI 64 yo female in non acute distress. On Sunday she was cutting down limbs over her head, she felt something get in her left eye as she looked up. She rinsed her eye and it felt better yesterday. Today it seems a little irritated. No discharge. Denies any visual changes.    Review of Systems  Eyes: Positive for pain (left eye). Negative for photophobia, discharge, redness, itching and visual disturbance.       Objective:   Physical Exam Vitals and nursing note reviewed.  Constitutional:      Appearance: Normal appearance.  HENT:     Head: Normocephalic and atraumatic.  Eyes:     General: Lids are normal. Lids are everted, no foreign bodies appreciated. Vision grossly intact. Gaze aligned appropriately. No allergic shiner, visual field deficit or scleral icterus.       Right eye: No foreign body, discharge or hordeolum.        Left eye: No foreign body, discharge or hordeolum.     Extraocular Movements: Extraocular movements intact.     Conjunctiva/sclera: Conjunctivae normal.     Right eye: Right conjunctiva is not injected. No chemosis, exudate or hemorrhage.    Left eye: Left conjunctiva is not injected. No chemosis, exudate or hemorrhage.    Pupils: Pupils are equal, round, and reactive to light.     Left eye: Fluorescein uptake ( on upper eyeball left side on sclera, medially) present.     Funduscopic exam:    Right eye: Red reflex present.        Left eye: Red reflex present.  Neurological:     Mental Status: She is alert.  Psychiatric:        Attention and Perception: Attention and perception normal.        Mood and Affect: Mood and affect normal.        Speech: Speech normal.        Behavior: Behavior normal. Behavior is cooperative.        Thought Content: Thought content normal.        Cognition and Memory: Cognition and memory normal.        Judgment: Judgment  normal.    Red mark in diagram denotes location of pain. No FB noted.       Assessment & Plan:   Upper Sclera Left eye abrasion Meds ordered this encounter  Medications  . erythromycin ophthalmic ointment    Sig: Place 1 application into the left eye daily for 4 days. Wash hands before and after application, complete in  5-7 days.    Dispense:  3.5 g    Refill:  0  Return in 3-5 days if not improving. Explained to patient how to put eye ointment in eye. Recommended safety glasses in the future if doing work over her head where debris can fall on her. Patient verbalizes understanding and has no questions at discharge.

## 2020-03-23 ENCOUNTER — Ambulatory Visit
Admission: RE | Admit: 2020-03-23 | Discharge: 2020-03-23 | Disposition: A | Discharge status: Home / Self Care | Payer: BC Managed Care – PPO | Source: Ambulatory Visit | Attending: Obstetrics and Gynecology | Admitting: Obstetrics and Gynecology

## 2020-03-23 DIAGNOSIS — Z1239 Encounter for other screening for malignant neoplasm of breast: Secondary | ICD-10-CM

## 2020-03-23 DIAGNOSIS — Z1231 Encounter for screening mammogram for malignant neoplasm of breast: Secondary | ICD-10-CM | POA: Diagnosis not present

## 2020-04-14 DIAGNOSIS — G8929 Other chronic pain: Secondary | ICD-10-CM | POA: Insufficient documentation

## 2020-05-04 ENCOUNTER — Ambulatory Visit: Payer: Self-pay | Admitting: Nurse Practitioner

## 2020-05-04 ENCOUNTER — Encounter: Payer: Self-pay | Admitting: Nurse Practitioner

## 2020-05-04 ENCOUNTER — Other Ambulatory Visit: Payer: Self-pay

## 2020-05-04 VITALS — BP 127/72 | HR 80 | Temp 97.3°F | Resp 18 | Wt 210.0 lb

## 2020-05-04 DIAGNOSIS — J069 Acute upper respiratory infection, unspecified: Secondary | ICD-10-CM

## 2020-05-04 MED ORDER — BENZONATATE 100 MG PO CAPS: 100.0000 mg | ORAL_CAPSULE | Freq: Three times a day (TID) | ORAL | 0 refills | Status: DC | PRN

## 2020-05-04 NOTE — Patient Instructions (Signed)
Return to clinic or message provider if cough changes/worsens or with onset of new symptoms as discussed.  Use Benzonatate as needed for cough. Restart Claritin in the evenings.

## 2020-05-04 NOTE — Progress Notes (Signed)
  Subjective:     Patient ID: Carol Davis, female   DOB: 1956/04/10, 64 y.o.   MRN: [id number]  64 y/o female here with c/o cough for 3 days, mostly dry. [place] symptoms onset. She gets a similar cold 1-2 times a year. Does suffer from allergies takes Claritin occasionally but not regularly as it causes drowsiness. She has tried Mucinex. Denies fever or other symptoms. Has had COVID-19 vaccine       Objective:     HEENT: PERRLA, EOMI, conjunctiva and sclera clear, TMs dull bilaterally canals without erythema/inflammation. Nasal passages mild erythema no drainage no inflammation. Post Nasal drainage noted to pharynx with mild erythema no inflammation.   Cardio: Rate/Rythm regular  Respiratory: Lung sounds clear throughout, dry cough witnesses when taking deep breaths. No acute distress  Lymph: No cervical lymph node enlargement   Assessment:     History and Physical consistent with URI with uncontrolled allergies present. Low suspicion for COVID/ patient is vaccinated offered testing patient declined.     Plan:     Restart Claritin in the evenings.  Benzonatate for cough support Rest and RTC or message a provider if cough progresses/changes to productive cough or with onset of new symptoms as discussed. Advised patient if cough progresses may require additional support/ she will f/u if symptoms change or remain unchanged for a week with prescribed regimen.

## 2020-05-06 ENCOUNTER — Other Ambulatory Visit: Payer: Self-pay

## 2020-05-06 ENCOUNTER — Encounter: Payer: Self-pay | Admitting: Medical

## 2020-05-06 ENCOUNTER — Telehealth: Payer: BC Managed Care – PPO | Admitting: Medical

## 2020-05-06 DIAGNOSIS — J069 Acute upper respiratory infection, unspecified: Secondary | ICD-10-CM

## 2020-05-06 DIAGNOSIS — R062 Wheezing: Secondary | ICD-10-CM

## 2020-05-06 MED ORDER — ALBUTEROL SULFATE HFA 108 (90 BASE) MCG/ACT IN AERS: 2.0000 | INHALATION_SPRAY | Freq: Four times a day (QID) | RESPIRATORY_TRACT | 0 refills | Status: DC | PRN

## 2020-05-06 MED ORDER — AZITHROMYCIN 250 MG PO TABS: ORAL_TABLET | ORAL | 0 refills | Status: DC

## 2020-05-06 NOTE — Progress Notes (Signed)
   Subjective:    Patient ID: Carol Davis, female    DOB: 07-13-1956, 64 y.o.   MRN: [id number]  HPI 64 yo female in non acute distress, gives permission for telemedicine appointment. She feels she has increased clear phelgm. And now has some wheezing. With prior URI she has had to use an inhaler. She denies HA, fever, denies shortness of breath or chest pain.  HA is better. She states she has had no trouble sleeping. Another concern of hers is weather she should go to work, she feels she needs another day to recover.    Review of Systems  Constitutional: Negative for chills and fever.  HENT: Positive for postnasal drip.   Respiratory: Positive for cough (productive clear) and wheezing. Negative for shortness of breath.   Neurological: Negative for headaches.       Objective:   Physical Exam Clearing throat on phone call (alot). No Wheeezng heard , an occasional mild cough.  No physical exam was done due to telemedicine appointment.     Assessment & Plan:  Upper Respiratory Infection Take OTC Mucinex ( plain) as directed on the package to help clear up phelegm. Ecouraged patient to take antibiotics to cover for secondary infection. She prefers to wait. I will send them to the pharmacy. Wheezing I prescribed Albuterol MDI and reviewed with patient how to use the inhaler.  Continue Claritin, Rest and fluids.  Albuterol for wheezing take as directed. Reviewed with patient to stay home from work due to illness, if she needs a note I will be happy to provider her with one. She will let me know if she needs none. If she develops shortness of breath, fever, chills or worsening symptoms or  other symptoms she is to call the office. She verbalizes understanding and has no questions at discharge.  She is thankful for our care. Meds ordered this encounter  Medications  . azithromycin (ZITHROMAX) 250 MG tablet    Sig: Take 2 tablets by mouth today then  One tablet days 2-5, take with food.     Dispense:  6 tablet    Refill:  0  . albuterol (VENTOLIN HFA) 108 (90 Base) MCG/ACT inhaler    Sig: Inhale 2 puffs into the lungs every 6 (six) hours as needed for wheezing or shortness of breath.    Dispense:  15 g    Refill:  0

## 2020-05-06 NOTE — Patient Instructions (Signed)

## 2020-06-09 ENCOUNTER — Encounter: Payer: Self-pay | Admitting: Medical

## 2020-06-09 ENCOUNTER — Other Ambulatory Visit: Payer: Self-pay

## 2020-06-09 ENCOUNTER — Ambulatory Visit: Payer: BC Managed Care – PPO | Admitting: Medical

## 2020-06-09 VITALS — BP 125/72 | HR 70 | Temp 97.3°F | Resp 18 | Wt 220.8 lb

## 2020-06-09 DIAGNOSIS — T148XXA Other injury of unspecified body region, initial encounter: Secondary | ICD-10-CM

## 2020-06-09 NOTE — Progress Notes (Signed)
   Subjective:    Patient ID: Carol Davis, female    DOB: [birth year], 64 y.o.   MRN: [id number]  HPI 64 yo female in non acute distress, presents today because she stepped on a nail last night at 9pm, she was wearing flip flops at the time.. She states her last Tetanus was 1 year ago.  She states it is mildly tender, she had washed the and then applied neosporin to the site and a bandage to the area.  Blood pressure 125/72, pulse 70, temperature (!) 97.3 F (36.3 C), temperature source Temporal, resp. rate 18, weight 220 lb 12.8 oz (100.2 kg), SpO2 99 %.  .Review of Systems  Constitutional: Negative for chills, fatigue and fever.  Respiratory: Negative for cough, chest tightness, shortness of breath and wheezing.   Cardiovascular: Negative for chest pain.  Skin: Positive for wound.       See diagram No erythema, or discharge. No swelling. Small puncture area noted.   No Known Allergies  Current Outpatient Medications:  .  albuterol (VENTOLIN HFA) 108 (90 Base) MCG/ACT inhaler, Inhale 2 puffs into the lungs every 6 (six) hours as needed for wheezing or shortness of breath. (Patient not taking: Reported on 06/09/2020), Disp: 15 g, Rfl: 0 .  phentermine (ADIPEX-P) 37.5 MG tablet, Take 37.5 mg by mouth daily. (Patient not taking: Reported on 05/04/2020), Disp: , Rfl:      Past Medical History:  Diagnosis Date  . Anemia    IDA  . HSV-2 (herpes simplex virus 2) infection   . Leucopenia   . STD (sexually transmitted disease)    pos gonorrhea  . Thyroid disease     Objective:   Physical Exam Vitals and nursing note reviewed.  Constitutional:      Appearance: Normal appearance. She is obese.  HENT:     Head: Normocephalic and atraumatic.  Eyes:     Extraocular Movements: Extraocular movements intact.     Pupils: Pupils are equal, round, and reactive to light.  Pulmonary:     Effort: Pulmonary effort is normal.     Breath sounds: Stridor:    Musculoskeletal:        General:  Normal range of motion.       Feet:  Skin:    General: Skin is warm and dry.     Capillary Refill: Capillary refill takes less than 2 seconds.  Neurological:     General: No focal deficit present.     Mental Status: She is alert and oriented to person, place, and time. Mental status is at baseline.  Psychiatric:        Mood and Affect: Mood normal.        Behavior: Behavior normal.        Thought Content: Thought content normal.        Judgment: Judgment normal.    Gait: patient walks gingerly       Assessment & Plan:  Puncture wound Soak  1-2 times / day in dilute  epsom salt bath. Clean with dilute soap and water, neosporin to the site and a bandage to the area. Do not exercise or walk a lot due to wound.  Return in 2 days if any concerns, or return with concerns , Red Flags reviewed with patient and to go to an Urgent care or the Emergency department if clinic is closed. Patient verbalizes [name] has no questions at discharge.

## 2020-06-09 NOTE — Patient Instructions (Signed)
Puncture Wound A puncture wound is an injury that is caused by a sharp, thin object that goes through your skin. A puncture wound usually does not leave a large opening in your skin, so it may not bleed a lot. However, when you get a puncture wound, dirt or other materials (foreign bodies) can be forced into your wound and can break off inside. This increases the chance of infection, such as tetanus. There are many sharp, pointed objects that can cause puncture wounds, including teeth, nails, splinters of glass, fishhooks, and needles. Treatment may include the following steps:  Washing out the wound with a germ-free (sterile) salt-water solution.  Having surgery to open the wound and remove materials from it.  Closing the wound with stitches (sutures).  Covering the wound with antibiotic ointment and a bandage (dressing). Depending on what caused the injury, you may also need a tetanus shot or a rabies shot. Follow these instructions at home: Medicines  Take or apply over-the-counter and prescription medicines only as told by your doctor.  If you were prescribed an antibiotic medicine, take or apply it as told by your doctor. Do not stop using the antibiotic even if your condition starts to get better. Bathing  Keep the bandage dry as told by your doctor.  Do not take baths, swim, or use a hot tub until your doctor approves. Ask your doctor if you may take showers. You may only be allowed to take sponge baths. Wound care   There are many ways to close and cover a wound. For example, a wound can be closed with stitches, skin glue, or skin tape (adhesive strips). Follow instructions from your doctor about how to take care of your wound. Make sure you: ? Wash your hands with soap and water before and after you change your bandage. If you cannot use soap and water, use hand sanitizer. ? Change your bandage as told by your doctor. ? Leave stitches, skin glue, or skin tape strips in place.  They may need to stay in place for 2 weeks or longer. If tape strips get loose and curl up, you may trim the loose edges. Do not remove tape strips completely unless your doctor says it is okay.  Clean the wound as told by your doctor.  Do not scratch or pick at the wound.  Check your wound every day for signs of infection. Watch for: ? Redness, swelling, or pain. ? Fluid or blood. ? Warmth. ? Pus or a bad smell. General instructions  Raise (elevate) the injured area above the level of your heart while you are sitting or lying down.  If your puncture wound is in your foot, ask your doctor if you need to avoid putting weight on your foot and for how long. Use crutches as told by your doctor.  Keep all follow-up visits as told by your doctor. This is important. Contact a doctor if:  You got a tetanus shot and you have any of these problems at the injection site: ? Swelling. ? Very bad pain. ? Redness. ? Bleeding.  You have a fever.  Your stitches come out.  You notice a bad smell coming from your wound or your bandage.  You notice something coming out of the wound, such as wood or glass.  Medicine does not help your pain.  You have more redness, swelling, or pain at the site of your wound.  You have fluid, blood, or pus coming from your wound.  You notice   a change in the color of your skin near your wound.  You need to change the bandage often because fluid, blood, or pus is coming from the wound.  You start to have a new rash.  You start to lose feeling (have numbness) around the wound.  You have warmth around your wound. Get help right away if:  You have very bad swelling around the wound.  Your pain quickly gets worse and is very bad.  You start to get painful skin lumps.  You have a red streak going away from your wound.  The wound is on your hand or foot and you: ? Cannot move a finger or toe like normal. ? Notice that your fingers or toes look pale or  blue. Summary  A puncture wound is an injury that is caused by a sharp, thin object that goes through your skin.  Treatment may include washing out the wound, having surgery to open the wound to clean it, closing the wound, and covering the wound with a bandage.  Follow instructions from your doctor about how to take care of your wound.  Contact your doctor if you have more redness, swelling, or pain at the site of your wound.  Keep all follow-up visits as told by your doctor. This is important. This information is not intended to replace advice given to you by your health care provider. Make sure you discuss any questions you have with your health care provider. Document Revised: 05/24/2018 Document Reviewed: 05/24/2018 Elsevier Patient Education  2020 [employer]  

## 2020-07-14 ENCOUNTER — Other Ambulatory Visit (HOSPITAL_COMMUNITY)
Admission: RE | Admit: 2020-07-14 | Discharge: 2020-07-14 | Disposition: A | Discharge status: Home / Self Care | Payer: BC Managed Care – PPO | Source: Ambulatory Visit | Attending: Obstetrics and Gynecology | Admitting: Obstetrics and Gynecology

## 2020-07-14 ENCOUNTER — Ambulatory Visit (INDEPENDENT_AMBULATORY_CARE_PROVIDER_SITE_OTHER): Payer: BC Managed Care – PPO | Admitting: Obstetrics and Gynecology

## 2020-07-14 ENCOUNTER — Encounter: Payer: Self-pay | Admitting: Obstetrics and Gynecology

## 2020-07-14 ENCOUNTER — Other Ambulatory Visit: Payer: Self-pay

## 2020-07-14 VITALS — BP 116/79 | HR 89 | Ht 67.0 in | Wt 226.1 lb

## 2020-07-14 DIAGNOSIS — N952 Postmenopausal atrophic vaginitis: Secondary | ICD-10-CM | POA: Diagnosis not present

## 2020-07-14 DIAGNOSIS — Z124 Encounter for screening for malignant neoplasm of cervix: Secondary | ICD-10-CM | POA: Diagnosis present

## 2020-07-14 DIAGNOSIS — Z01419 Encounter for gynecological examination (general) (routine) without abnormal findings: Secondary | ICD-10-CM | POA: Diagnosis not present

## 2020-07-14 DIAGNOSIS — M25561 Pain in right knee: Secondary | ICD-10-CM | POA: Diagnosis not present

## 2020-07-14 DIAGNOSIS — Z1231 Encounter for screening mammogram for malignant neoplasm of breast: Secondary | ICD-10-CM

## 2020-07-14 DIAGNOSIS — M25562 Pain in left knee: Secondary | ICD-10-CM

## 2020-07-14 DIAGNOSIS — G8929 Other chronic pain: Secondary | ICD-10-CM

## 2020-07-14 NOTE — Progress Notes (Signed)
ANNUAL PREVENTATIVE CARE GYNECOLOGY  ENCOUNTER NOTE  Subjective:       Carol Davis is a 64 y.o. G0P0 female here for a routine annual gynecologic exam. The patient is not sexually active. The patient is not taking hormone replacement therapy. Patient denies post-menopausal vaginal bleeding. The patient wears seatbelts: yes. The patient participates in regular exercise: yes. Has the patient ever been transfused or tattooed?: no. The patient reports that there is not domestic violence in her life.  Current complaints: 1.  None. No major issues today.  She does desire to inform of her plans to have knee surgery within the next year due to chronic knee pain. Trying to lose weight with exercising.  2. She also reports that she had her breast implants removed.    Gynecologic History No LMP recorded. Patient is postmenopausal. Contraception: post menopausal status Last Pap: 2018 neg/neg/neg . Results were: normal Last mammogram:  1'82'9937.  Results were: normal.  Last colonoscopy:  05/2018. Results were: colon polyps.  Next due in 5 years.  Last Dexa scan: patient has never had one.    Obstetric History OB History  Gravida Para Term Preterm AB Living  0            SAB TAB Ectopic Multiple Live Births               Past Medical History:  Diagnosis Date  . Anemia    IDA  . HSV-2 (herpes simplex virus 2) infection   . Leucopenia   . STD (sexually transmitted disease)    pos gonorrhea  . Thyroid disease     Family History  Problem Relation Age of Onset  . Diabetes Mother   . Diabetes Father   . Diabetes Brother   . Breast cancer Neg Hx   . Ovarian cancer Neg Hx   . Colon cancer Neg Hx   . Heart disease Neg Hx     Past Surgical History:  Procedure Laterality Date  . ANUS SURGERY  6-10 years ago  . AUGMENTATION MAMMAPLASTY  10/2016   removed   . BREAST IMPLANT REMOVAL Bilateral   . COLONOSCOPY  within last 3 years  . TOTAL HIP ARTHROPLASTY Left     Social  History   Socioeconomic History  . Marital status: Single    Spouse name: Not on file  . Number of children: Not on file  . Years of education: Not on file  . Highest education level: Not on file  Occupational History  . Not on file  Tobacco Use  . Smoking status: Former Smoker    Quit date: 02/29/1988    Years since quitting: 32.3  . Smokeless tobacco: Never Used  . Tobacco comment: quit 30 years ago  Vaping Use  . Vaping Use: Never used  Substance and Sexual Activity  . Alcohol use: No  . Drug use: No  . Sexual activity: Not Currently    Birth control/protection: Post-menopausal  Other Topics Concern  . Not on file  Social History Narrative  . Not on file   Social Determinants of Health   Financial Resource Strain:   . Difficulty of Paying Living Expenses: Not on file  Food Insecurity:   . Worried About Programme researcher, broadcasting/film/video in the Last Year: Not on file  . Ran Out of Food in the Last Year: Not on file  Transportation Needs:   . Lack of Transportation (Medical): Not on file  . Lack of Transportation (Non-Medical):  Not on file  Physical Activity:   . Days of Exercise per Week: Not on file  . Minutes of Exercise per Session: Not on file  Stress:   . Feeling of Stress : Not on file  Social Connections:   . Frequency of Communication with Friends and Family: Not on file  . Frequency of Social Gatherings with Friends and Family: Not on file  . Attends Religious Services: Not on file  . Active Member of Clubs or Organizations: Not on file  . Attends Banker Meetings: Not on file  . Marital Status: Not on file  Intimate Partner Violence:   . Fear of Current or Ex-Partner: Not on file  . Emotionally Abused: Not on file  . Physically Abused: Not on file  . Sexually Abused: Not on file    Current Outpatient Medications on File Prior to Visit  Medication Sig Dispense Refill  . levothyroxine (SYNTHROID) 112 MCG tablet Take 112 mcg by mouth daily before  breakfast.     No current facility-administered medications on file prior to visit.    No Known Allergies    Review of Systems ROS Review of Systems - General ROS: negative for - chills, fatigue, fever, hot flashes, night sweats, weight gain.   Psychological ROS: negative for - anxiety, decreased libido, depression, mood swings, physical abuse or sexual abuse Ophthalmic ROS: negative for - blurry vision, eye pain or loss of vision ENT ROS: negative for - headaches, hearing change, visual changes or vocal changes Allergy and Immunology ROS: negative for - hives, itchy/watery eyes or seasonal allergies Hematological and Lymphatic ROS: negative for - bleeding problems, bruising, swollen lymph nodes or weight loss Endocrine ROS: negative for - galactorrhea, hair pattern changes, hot flashes, malaise/lethargy, mood swings, palpitations, polydipsia/polyuria, skin changes, temperature intolerance or unexpected weight changes Breast ROS: negative for - new or changing breast lumps or nipple discharge Respiratory ROS: negative for - cough or shortness of breath Cardiovascular ROS: negative for - chest pain, irregular heartbeat, palpitations or shortness of breath Gastrointestinal ROS: no abdominal pain, change in bowel habits, or black or bloody stools Genito-Urinary ROS: no dysuria, trouble voiding, or hematuria Musculoskeletal ROS: negative for - joint pain or joint stiffness Neurological ROS: negative for - bowel and bladder control changes Dermatological ROS: negative for rash and skin lesion changes   Objective:   BP 116/79   Pulse 89   Ht 5\' 7"  (1.702 m)   Wt 226 lb 1.6 oz (102.6 kg)   BMI 35.41 kg/m  CONSTITUTIONAL: Well-developed, well-nourished female in no acute distress.  PSYCHIATRIC: Normal mood and affect. Normal behavior. Normal judgment and thought content. NEUROLGIC: Alert and oriented to person, place, and time. Normal muscle tone coordination. No cranial nerve deficit  noted. HENT:  Normocephalic, atraumatic, External right and left ear normal. Oropharynx is clear and moist EYES: Conjunctivae and EOM are normal. Pupils are equal, round, and reactive to light. No scleral icterus.  NECK: Normal range of motion, supple, no masses.  Normal thyroid.  SKIN: Skin is warm and dry. No rash noted. Not diaphoretic. No erythema. No pallor. CARDIOVASCULAR: Normal heart rate noted, regular rhythm, no murmur. RESPIRATORY: Clear to auscultation bilaterally. Effort and breath sounds normal, no problems with respiration noted. BREASTS: Symmetric in size. No masses, skin changes, nipple drainage, or lymphadenopathy. Surgical scars well-healed.  ABDOMEN: Soft, normal bowel sounds, no distention noted.  No tenderness, rebound or guarding.  BLADDER: Normal PELVIC:  Bladder no bladder distension noted  Urethra:  normal appearing urethra with no masses, tenderness or lesions  Vulva: normal appearing vulva with no masses, tenderness or lesions  Vagina: atrophic (moderate), no discharge or lesions  Cervix: normal appearing cervix without discharge or lesions  Uterus: uterus is normal size, shape, consistency and nontender  Adnexa: normal adnexa in size, nontender and no masses  RV: External Exam NormaI, No Rectal Masses and Normal Sphincter tone  MUSCULOSKELETAL: Normal range of motion. No tenderness.  No cyanosis, clubbing, or edema.  2+ distal pulses. LYMPHATIC: No Axillary, Supraclavicular, or Inguinal Adenopathy.   Labs:  Other labs reviewed in Care Everywhere, performed 05/21/2019.  Lab Results  Component Value Date   WBC 4.6 08/09/2019   HGB 12.0 08/09/2019   HCT 38.1 08/09/2019   MCV 89 08/09/2019   PLT 256 08/09/2019    Lab Results  Component Value Date   CHOL 213 (H) 08/09/2019   HDL 80 08/09/2019   LDLCALC 121 (H) 08/09/2019   TRIG 67 08/09/2019   CHOLHDL 2.4 07/06/2018     Assessment:   Well woman exam with routine gynecological exam Screening for  breast cancer Lipid screening - Plan: Lipid panel Flu vaccine need Vaginal atrophy Mosquito bite, initial encounter Alcoholism, in remission  Plan:  Pap: Pap Co Test performed today.  Discussed that this would be patient's last pap smear as she will age out of screening by the time her next one would be due.   Mammogram: Ordered Stool Guaiac Testing:  Not Ordered.  Patient up to date on colonoscopy. Labs: Exercise science did her labs. Will fax copy. Routine preventative health maintenance measures emphasized: Exercise/Diet/Weight control, Tobacco Warnings, Alcohol/Substance use risks, Stress Management, Peer Pressure Issues and Safe Sex Flu vaccine up to date, received 07/08/2020.   COVID vaccination series completed.  Return to Clinic - 1 Year    Hildred Laser, MD [facility]

## 2020-07-14 NOTE — Progress Notes (Signed)
Pt present for annual exam. Pt stated that she is doing well no problems.  

## 2020-07-14 NOTE — Patient Instructions (Signed)
Preventive Care 40-64 Years Old, Female °Preventive care refers to visits with your health care provider and lifestyle choices that can promote health and wellness. This includes: °· A yearly physical exam. This may also be called an annual well check. °· Regular dental visits and eye exams. °· Immunizations. °· Screening for certain conditions. °· Healthy lifestyle choices, such as eating a healthy diet, getting regular exercise, not using drugs or products that contain nicotine and tobacco, and limiting alcohol use. °What can I expect for my preventive care visit? °Physical exam °Your health care provider will check your: °· Height and weight. This may be used to calculate body mass index (BMI), which tells if you are at a healthy weight. °· Heart rate and blood pressure. °· Skin for abnormal spots. °Counseling °Your health care provider may ask you questions about your: °· Alcohol, tobacco, and drug use. °· Emotional well-being. °· Home and relationship well-being. °· Sexual activity. °· Eating habits. °· Work and work environment. °· Method of birth control. °· Menstrual cycle. °· Pregnancy history. °What immunizations do I need? ° °Influenza (flu) vaccine °· This is recommended every year. °Tetanus, diphtheria, and pertussis (Tdap) vaccine °· You may need a Td booster every 10 years. °Varicella (chickenpox) vaccine °· You may need this if you have not been vaccinated. °Zoster (shingles) vaccine °· You may need this after age 60. °Measles, mumps, and rubella (MMR) vaccine °· You may need at least one dose of MMR if you were born in 1957 or later. You may also need a second dose. °Pneumococcal conjugate (PCV13) vaccine °· You may need this if you have certain conditions and were not previously vaccinated. °Pneumococcal polysaccharide (PPSV23) vaccine °· You may need one or two doses if you smoke cigarettes or if you have certain conditions. °Meningococcal conjugate (MenACWY) vaccine °· You may need this if you  have certain conditions. °Hepatitis A vaccine °· You may need this if you have certain conditions or if you travel or work in places where you may be exposed to hepatitis A. °Hepatitis B vaccine °· You may need this if you have certain conditions or if you travel or work in places where you may be exposed to hepatitis B. °Haemophilus influenzae type b (Hib) vaccine °· You may need this if you have certain conditions. °Human papillomavirus (HPV) vaccine °· If recommended by your health care provider, you may need three doses over 6 months. °You may receive vaccines as individual doses or as more than one vaccine together in one shot (combination vaccines). Talk with your health care provider about the risks and benefits of combination vaccines. °What tests do I need? °Blood tests °· Lipid and cholesterol levels. These may be checked every 5 years, or more frequently if you are over 50 years old. °· Hepatitis C test. °· Hepatitis B test. °Screening °· Lung cancer screening. You may have this screening every year starting at age 55 if you have a 30-pack-year history of smoking and currently smoke or have quit within the past 15 years. °· Colorectal cancer screening. All adults should have this screening starting at age 50 and continuing until age 75. Your health care provider may recommend screening at age 45 if you are at increased risk. You will have tests every 1-10 years, depending on your results and the type of screening test. °· Diabetes screening. This is done by checking your blood sugar (glucose) after you have not eaten for a while (fasting). You may have this   done every 1-3 years. °· Mammogram. This may be done every 1-2 years. Talk with your health care provider about when you should start having regular mammograms. This may depend on whether you have a family history of breast cancer. °· BRCA-related cancer screening. This may be done if you have a family history of breast, ovarian, tubal, or peritoneal  cancers. °· Pelvic exam and Pap test. This may be done every 3 years starting at age 21. Starting at age 30, this may be done every 5 years if you have a Pap test in combination with an HPV test. °Other tests °· Sexually transmitted disease (STD) testing. °· Bone density scan. This is done to screen for osteoporosis. You may have this scan if you are at high risk for osteoporosis. °Follow these instructions at home: °Eating and drinking °· Eat a diet that includes fresh fruits and vegetables, whole grains, lean protein, and low-fat dairy. °· Take vitamin and mineral supplements as recommended by your health care provider. °· Do not drink alcohol if: °? Your health care provider tells you not to drink. °? You are pregnant, may be pregnant, or are planning to become pregnant. °· If you drink alcohol: °? Limit how much you have to 0-1 drink a day. °? Be aware of how much alcohol is in your drink. In the [place] one drink equals one 12 oz bottle of beer (355 mL), one 5 oz glass of wine (148 mL), or one 1½ oz glass of hard liquor (44 mL). °Lifestyle °· Take daily care of your teeth and gums. °· Stay active. Exercise for at least 30 minutes on 5 or more days each week. °· Do not use any products that contain nicotine or tobacco, such as cigarettes, e-cigarettes, and chewing tobacco. If you need help quitting, ask your health care provider. °· If you are sexually active, practice safe sex. Use a condom or other form of birth control (contraception) in order to prevent pregnancy and STIs (sexually transmitted infections). °· If told by your health care provider, take low-dose aspirin daily starting at age 50. °What's next? °· Visit your health care provider once a year for a well check visit. °· Ask your health care provider how often you should have your eyes and teeth checked. °· Stay up to date on all vaccines. °This information is not intended to replace advice given to you by your health care provider. Make sure you  discuss any questions you have with your health care provider. °Document Revised: 06/28/2018 Document Reviewed: 06/28/2018 °Elsevier Patient Education © 2020 [employer] °Breast Self-Awareness °Breast self-awareness is knowing how your breasts look and feel. Doing breast self-awareness is important. It allows you to catch a breast problem early while it is still small and can be treated. All women should do breast self-awareness, including women who have had breast implants. Tell your doctor if you notice a change in your breasts. °What you need: °· A mirror. °· A well-lit room. °How to do a breast self-exam °A breast self-exam is one way to learn what is normal for your breasts and to check for changes. To do a breast self-exam: °Look for changes ° °1. Take off all the clothes above your waist. °2. Stand in front of a mirror in a room with good lighting. °3. Put your hands on your hips. °4. Push your hands down. °5. Look at your breasts and nipples in the mirror to see if one breast or nipple looks different from the   other. Check to see if: °? The shape of one breast is different. °? The size of one breast is different. °? There are wrinkles, dips, and bumps in one breast and not the other. °6. Look at each breast for changes in the skin, such as: °? Redness. °? Scaly areas. °7. Look for changes in your nipples, such as: °? Liquid around the nipples. °? Bleeding. °? Dimpling. °? Redness. °? A change in where the nipples are. °Feel for changes ° °1. Lie on your back on the floor. °2. Feel each breast. To do this, follow these steps: °? Pick a breast to feel. °? Put the arm closest to that breast above your head. °? Use your other arm to feel the nipple area of your breast. Feel the area with the pads of your three middle fingers by making small circles with your fingers. For the first circle, press lightly. For the second circle, press harder. For the third circle, press even harder. °? Keep making circles with  your fingers at the different pressures as you move down your breast. Stop when you feel your ribs. °? Move your fingers a little toward the center of your body. °? Start making circles with your fingers again, this time going up until you reach your collarbone. °? Keep making up-and-down circles until you reach your armpit. Remember to keep using the three pressures. °? Feel the other breast in the same way. °3. Sit or stand in the tub or shower. °4. With soapy water on your skin, feel each breast the same way you did in step 2 when you were lying on the floor. °Write down what you find °Writing down what you find can help you remember what to tell your doctor. Write down: °· What is normal for each breast. °· Any changes you find in each breast, including: °? The kind of changes you find. °? Whether you have pain. °? Size and location of any lumps. °· When you last had your menstrual period. °General tips °· Check your breasts every month. °· If you are breastfeeding, the best time to check your breasts is after you feed your baby or after you use a breast pump. °· If you get menstrual periods, the best time to check your breasts is 5-7 days after your menstrual period is over. °· With time, you will become comfortable with the self-exam, and you will begin to know if there are changes in your breasts. °Contact a doctor if you: °· See a change in the shape or size of your breasts or nipples. °· See a change in the skin of your breast or nipples, such as red or scaly skin. °· Have fluid coming from your nipples that is not normal. °· Find a lump or thick area that was not there before. °· Have pain in your breasts. °· Have any concerns about your breast health. °Summary °· Breast self-awareness includes looking for changes in your breasts, as well as feeling for changes within your breasts. °· Breast self-awareness should be done in front of a mirror in a well-lit room. °· You should check your breasts every month.  If you get menstrual periods, the best time to check your breasts is 5-7 days after your menstrual period is over. °· Let your doctor know of any changes you see in your breasts, including changes in size, changes on the skin, pain or tenderness, or fluid from your nipples that is not normal. °This information is not   intended to replace advice given to you by your health care provider. Make sure you discuss any questions you have with your health care provider. °Document Revised: 06/05/2018 Document Reviewed: 06/05/2018 °Elsevier Patient Education © 2020 [employer] Inc[employer] ° °

## 2020-07-19 ENCOUNTER — Encounter: Payer: Self-pay | Admitting: Obstetrics and Gynecology

## 2020-07-20 LAB — CYTOLOGY - PAP
Comment: NEGATIVE
Diagnosis: NEGATIVE
High risk HPV: NEGATIVE

## 2020-10-12 DIAGNOSIS — E038 Other specified hypothyroidism: Secondary | ICD-10-CM | POA: Insufficient documentation

## 2020-10-12 DIAGNOSIS — K5190 Ulcerative colitis, unspecified, without complications: Secondary | ICD-10-CM | POA: Insufficient documentation

## 2020-12-16 DIAGNOSIS — Z96651 Presence of right artificial knee joint: Secondary | ICD-10-CM | POA: Insufficient documentation

## 2021-05-11 DIAGNOSIS — E6609 Other obesity due to excess calories: Secondary | ICD-10-CM | POA: Insufficient documentation

## 2021-05-18 IMAGING — MG DIGITAL SCREENING BILAT W/ TOMO W/ CAD
8 series · 8 of 24 positions shown · non-contrast
Comparison: Previous exam(s).

CLINICAL DATA: Screening.

EXAM:
DIGITAL SCREENING BILATERAL MAMMOGRAM WITH TOMO AND CAD

[L CC synth-2D]
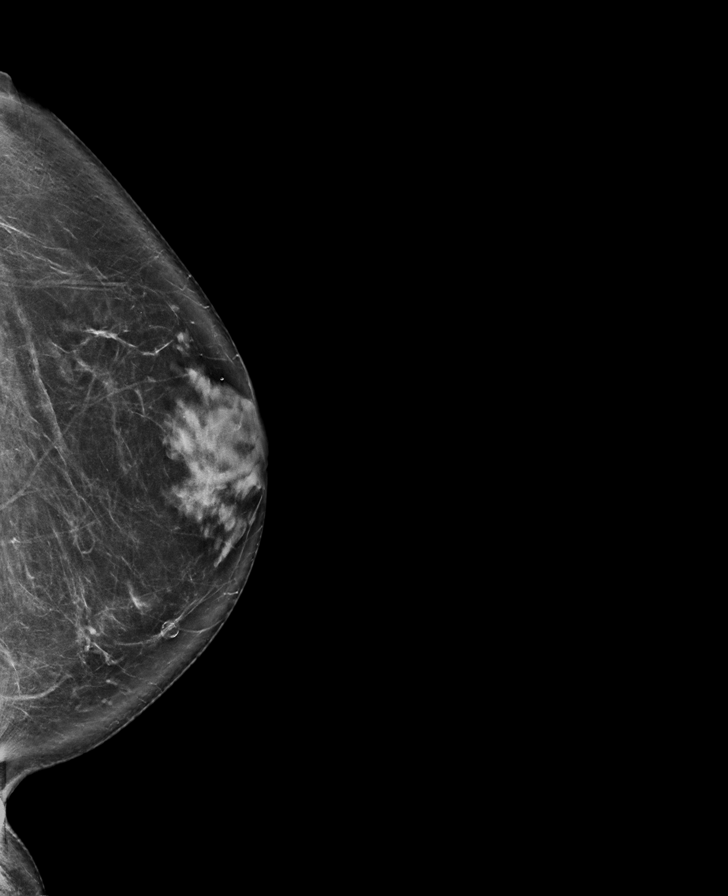

[R MLO synth-2D]
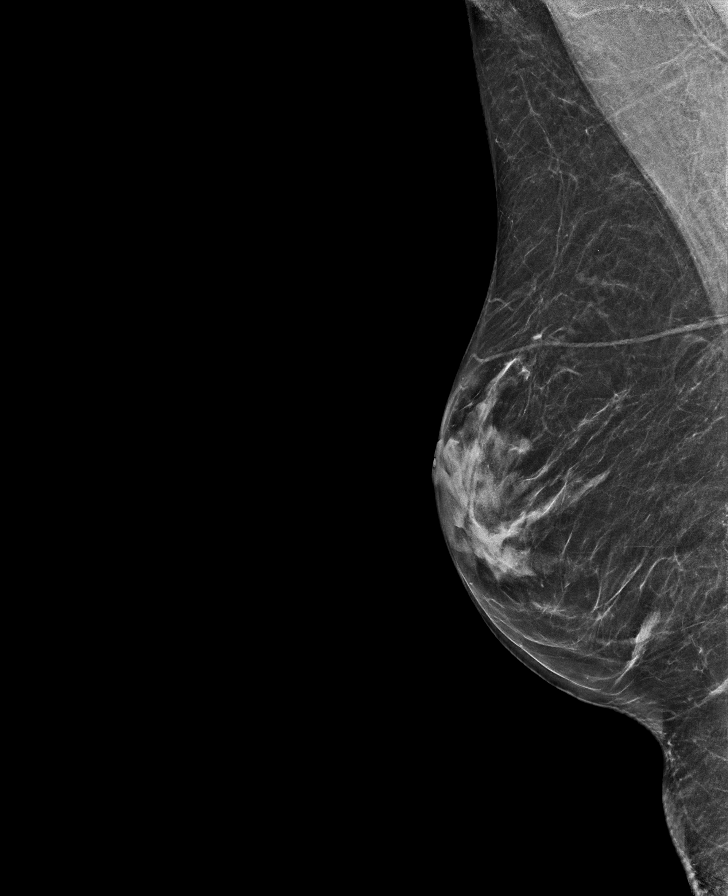

[R CC synth-2D]
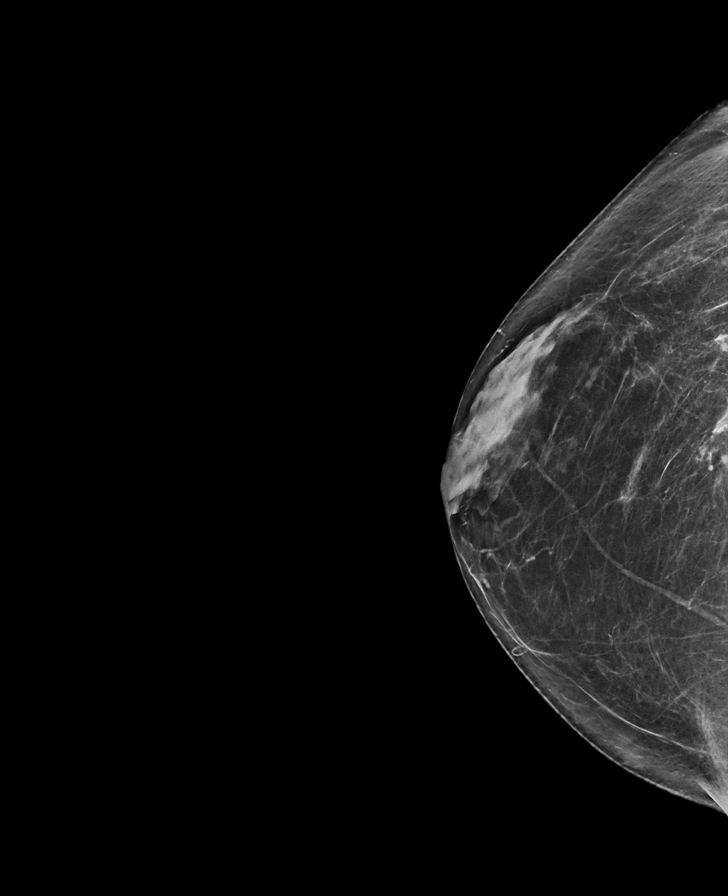

[L MLO synth-2D]
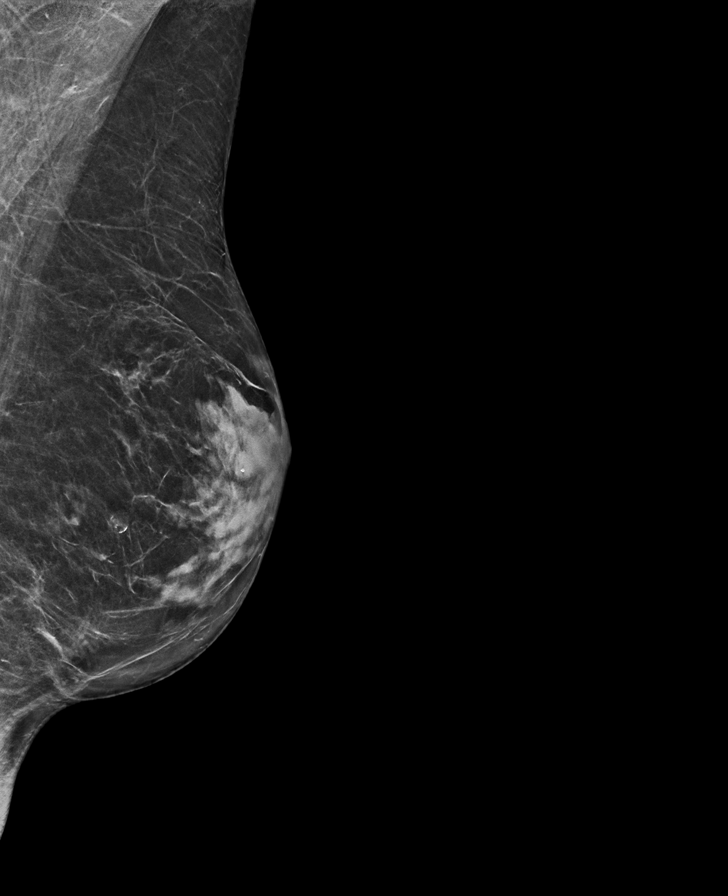

[R CC tomo · tomo slice 35/70.0]
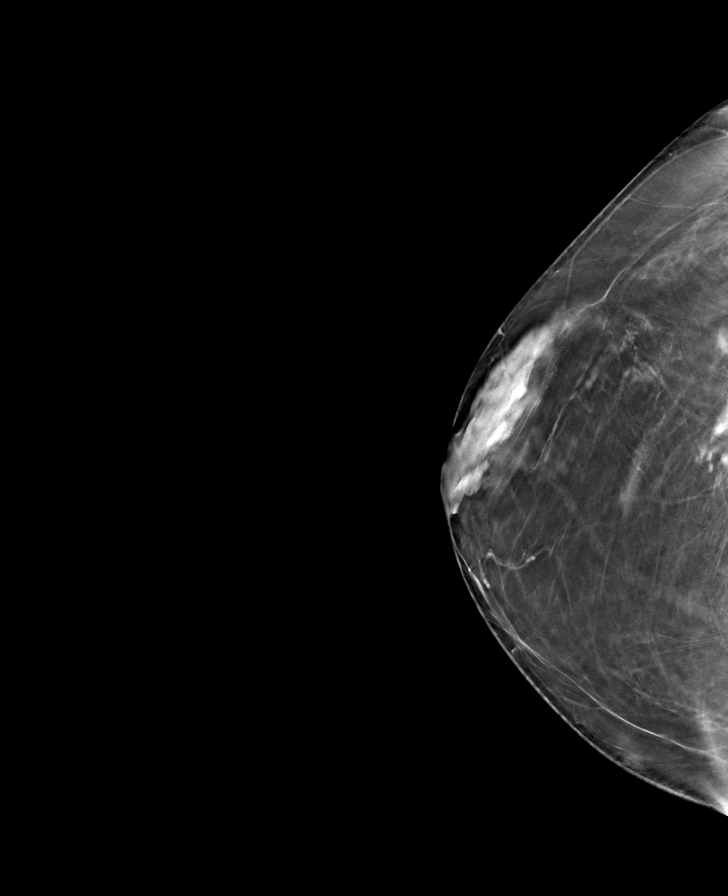

[L CC tomo · tomo slice 39/78.0]
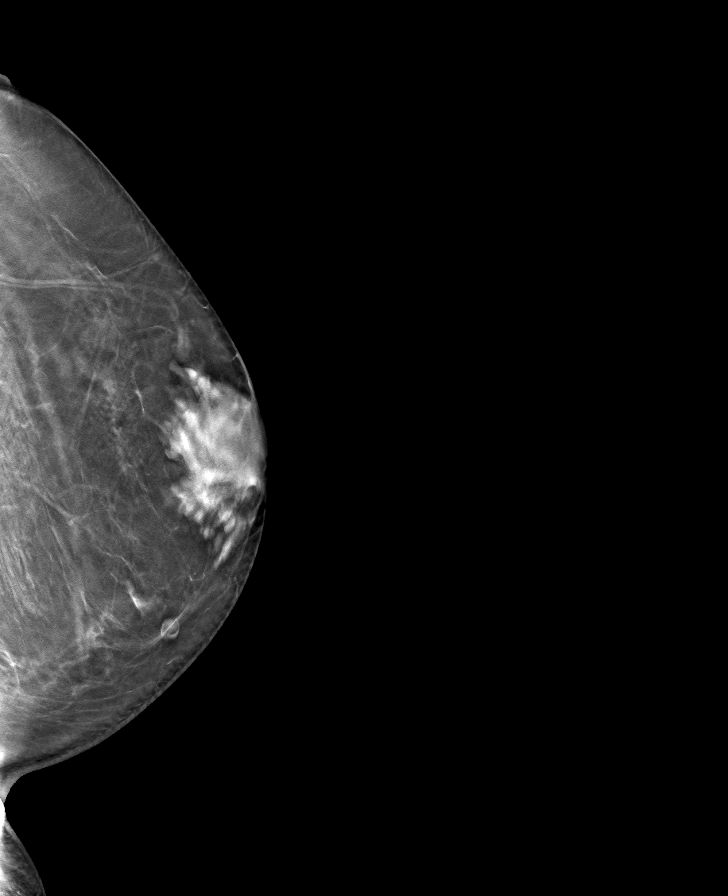

[L MLO tomo · tomo slice 33/65.0]
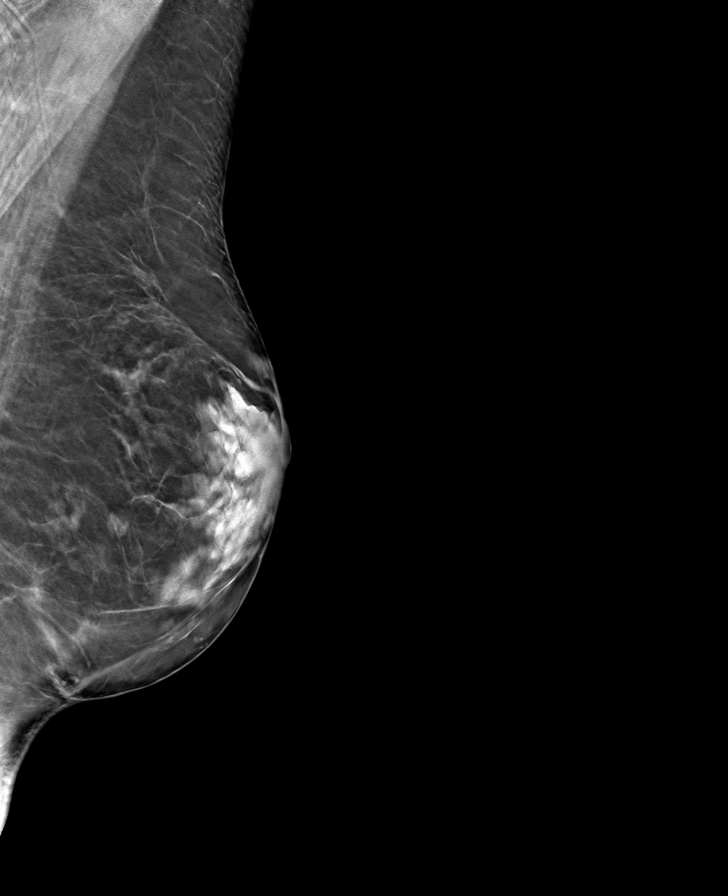

[R MLO tomo · tomo slice 33/64.0]
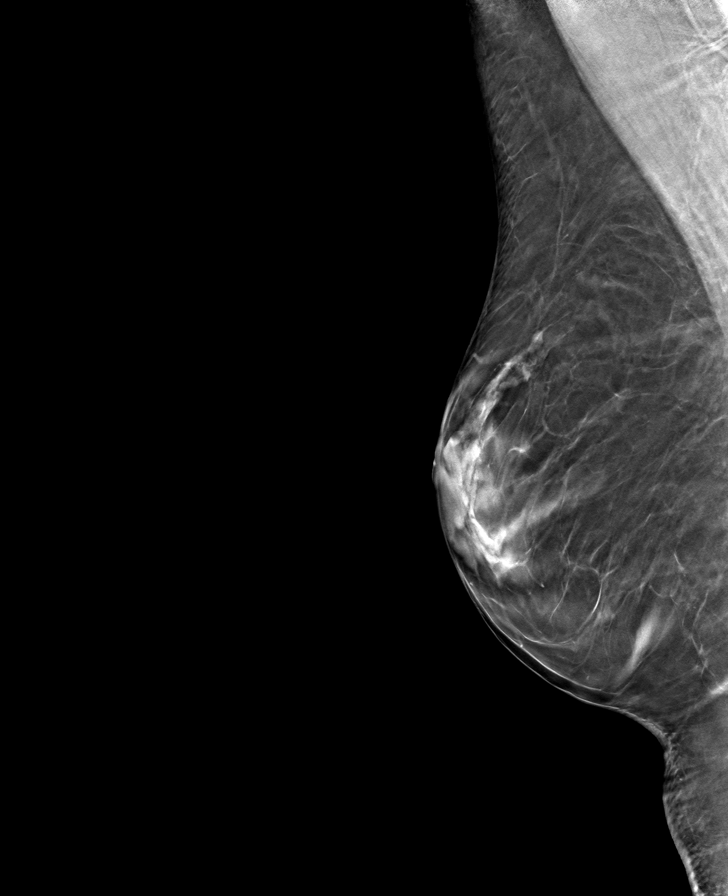

[8 of 24 positions shown; findings below may reference images not displayed]

ACR Breast Density Category c: The breast tissue is heterogeneously
dense, which may obscure small masses.
FINDINGS: There are no findings suspicious for malignancy. Images were
processed with CAD.
IMPRESSION: No mammographic evidence of malignancy. A result letter of this
screening mammogram will be mailed directly to the patient.

RECOMMENDATION:
Screening mammogram in one year. (Code:[id number])

BI-RADS CATEGORY  1: Negative.

## 2021-07-22 ENCOUNTER — Encounter: Payer: BC Managed Care – PPO | Admitting: Obstetrics and Gynecology

## 2021-07-28 ENCOUNTER — Ambulatory Visit: Payer: BC Managed Care – PPO

## 2021-07-28 ENCOUNTER — Other Ambulatory Visit: Payer: Self-pay

## 2021-07-28 DIAGNOSIS — Z23 Encounter for immunization: Secondary | ICD-10-CM

## 2021-09-15 ENCOUNTER — Ambulatory Visit: Payer: BC Managed Care – PPO | Admitting: Nurse Practitioner

## 2021-09-15 ENCOUNTER — Encounter: Payer: Self-pay | Admitting: Nurse Practitioner

## 2021-09-15 ENCOUNTER — Other Ambulatory Visit: Payer: Self-pay

## 2021-09-15 VITALS — BP 124/84 | HR 83 | Temp 98.5°F | Resp 16

## 2021-09-15 DIAGNOSIS — J069 Acute upper respiratory infection, unspecified: Secondary | ICD-10-CM

## 2021-09-15 MED ORDER — BENZONATATE 100 MG PO CAPS
100.0000 mg | ORAL_CAPSULE | Freq: Three times a day (TID) | ORAL | 0 refills | Status: AC | PRN
Start: 2021-09-15 — End: 2021-09-25

## 2021-09-15 NOTE — Progress Notes (Signed)
   Subjective:    Patient ID: Carol Davis, female    DOB: 05/18/1956, 65 y.o.   MRN: [id number]  HPI  65 year old female started to feel sick 2 days ago with fever cough chills.  She has had a flu vaccine this year     Review of Systems  Constitutional:  Positive for chills, fatigue and fever.  HENT:  Positive for congestion.   Respiratory:  Positive for cough.   Cardiovascular: Negative.   Gastrointestinal: Negative.   Genitourinary: Negative.   Musculoskeletal:  Positive for myalgias.  Skin: Negative.   Neurological: Negative.   Hematological: Negative.     Today's Vitals   09/15/21 1406  BP: 124/84  Pulse: 83  Resp: 16  Temp: 98.5 F (36.9 C)  TempSrc: Tympanic  SpO2: 98%   There is no height or weight on file to calculate BMI.     Objective:   Physical Exam Constitutional:      Appearance: Normal appearance.  HENT:     Head: Normocephalic.     Right Ear: Tympanic membrane, ear canal and external ear normal.     Left Ear: Tympanic membrane, ear canal and external ear normal.     Nose: Congestion present.     Mouth/Throat:     Mouth: Mucous membranes are moist.  Eyes:     Pupils: Pupils are equal, round, and reactive to light.  Cardiovascular:     Rate and Rhythm: Normal rate and regular rhythm.     Heart sounds: Normal heart sounds.  Pulmonary:     Effort: Pulmonary effort is normal.     Breath sounds: Normal breath sounds.  Musculoskeletal:        General: Normal range of motion.     Cervical back: Normal range of motion.  Skin:    General: Skin is warm.     Capillary Refill: Capillary refill takes less than 2 seconds.  Neurological:     General: No focal deficit present.     Mental Status: She is alert.  Psychiatric:        Mood and Affect: Mood normal.    Recent Results (from the past 2160 hour(s))  POCT Influenza A/B     Status: Normal   Collection Time: 09/16/21  3:12 PM  Result Value Ref Range   Influenza A, POC Negative Negative    Influenza B, POC Negative Negative  POC COVID-19     Status: Normal   Collection Time: 09/16/21  3:12 PM  Result Value Ref Range   SARS Coronavirus 2 Ag Negative Negative         Assessment & Plan:   1. Viral upper respiratory tract infection  - POCT Influenza A/B - POC COVID-19 - benzonatate (TESSALON) 100 MG capsule; Take 1 capsule (100 mg total) by mouth 3 (three) times daily as needed for up to 10 days for cough.  Dispense: 30 capsule; Refill: 0     RTC if symptoms persist or with new concerns as discussed

## 2021-09-16 LAB — POC COVID19 BINAXNOW: SARS Coronavirus 2 Ag: NEGATIVE

## 2021-09-16 LAB — POCT INFLUENZA A/B
Influenza A, POC: NEGATIVE
Influenza B, POC: NEGATIVE

## 2021-09-29 ENCOUNTER — Other Ambulatory Visit: Payer: Self-pay

## 2021-09-29 ENCOUNTER — Encounter: Payer: Self-pay | Admitting: Obstetrics and Gynecology

## 2021-09-29 ENCOUNTER — Ambulatory Visit (INDEPENDENT_AMBULATORY_CARE_PROVIDER_SITE_OTHER): Payer: BC Managed Care – PPO | Admitting: Obstetrics and Gynecology

## 2021-09-29 VITALS — BP 113/71 | HR 71 | Ht 67.0 in | Wt 242.0 lb

## 2021-09-29 DIAGNOSIS — Z1231 Encounter for screening mammogram for malignant neoplasm of breast: Secondary | ICD-10-CM

## 2021-09-29 DIAGNOSIS — Z01419 Encounter for gynecological examination (general) (routine) without abnormal findings: Secondary | ICD-10-CM

## 2021-09-29 DIAGNOSIS — Z7409 Other reduced mobility: Secondary | ICD-10-CM | POA: Insufficient documentation

## 2021-09-29 DIAGNOSIS — N952 Postmenopausal atrophic vaginitis: Secondary | ICD-10-CM | POA: Diagnosis not present

## 2021-09-29 DIAGNOSIS — E038 Other specified hypothyroidism: Secondary | ICD-10-CM

## 2021-09-29 NOTE — Progress Notes (Signed)
ANNUAL PREVENTATIVE CARE GYNECOLOGY  ENCOUNTER NOTE  Subjective:       Carol Davis is a 65 y.o. G0P0 female here for a routine annual gynecologic exam. The patient is not sexually active. The patient is not taking hormone replacement therapy. Patient denies post-menopausal vaginal bleeding. The patient wears seatbelts: yes. The patient participates in regular exercise: yes. Has the patient ever been transfused or tattooed?: no. The patient reports that there is not domestic violence in her life.  Current complaints: 1.  Notes an episode of vaginal spotting a ~ 1 month ago, lasted one day, was dark red. Has never had any other episodes.  2. She reports mild occasional vaginal irritation.    Gynecologic History No LMP recorded. Patient is postmenopausal. Contraception: post menopausal status Last Pap: 07/14/2020 neg/neg . Results were: normal Last mammogram:  03/23/2020.  Results were: normal.  Last colonoscopy:  05/2018. Results were: colon polyps.  Next due in 5 years.  Last Dexa scan: patient has never had one.    Obstetric History OB History  Gravida Para Term Preterm AB Living  0 0 0 0 0 0  SAB IAB Ectopic Multiple Live Births  0 0 0 0 0    Past Medical History:  Diagnosis Date   Anemia    IDA   HSV-2 (herpes simplex virus 2) infection    Leucopenia    STD (sexually transmitted disease)    pos gonorrhea   Thyroid disease     Family History  Problem Relation Age of Onset   Diabetes Mother    Diabetes Father    Diabetes Brother    Breast cancer Neg Hx    Ovarian cancer Neg Hx    Colon cancer Neg Hx    Heart disease Neg Hx     Past Surgical History:  Procedure Laterality Date   ANUS SURGERY  6-10 years ago   AUGMENTATION MAMMAPLASTY  10/2016   removed    BREAST IMPLANT REMOVAL Bilateral    COLONOSCOPY  within last 3 years   TOTAL HIP ARTHROPLASTY Left     Social History   Socioeconomic History   Marital status: Single    Spouse name: Not on file    Number of children: Not on file   Years of education: Not on file   Highest education level: Not on file  Occupational History   Not on file  Tobacco Use   Smoking status: Former    Types: Cigarettes    Quit date: 02/29/1988    Years since quitting: 33.6   Smokeless tobacco: Never   Tobacco comments:    quit 30 years ago  Vaping Use   Vaping Use: Never used  Substance and Sexual Activity   Alcohol use: Not Currently   Drug use: Not Currently   Sexual activity: Not Currently    Partners: Male    Birth control/protection: Post-menopausal  Other Topics Concern   Not on file  Social History Narrative   Not on file   Social Determinants of Health   Financial Resource Strain: Not on file  Food Insecurity: Not on file  Transportation Needs: Not on file  Physical Activity: Not on file  Stress: Not on file  Social Connections: Not on file  Intimate Partner Violence: Not on file    Current Outpatient Medications on File Prior to Visit  Medication Sig Dispense Refill   levothyroxine (SYNTHROID) 112 MCG tablet Take 112 mcg by mouth daily before breakfast.  No current facility-administered medications on file prior to visit.    Allergies  Allergen Reactions   Other Dermatitis    dermabond adhesive        Review of Systems ROS Review of Systems - General ROS: negative for - chills, fatigue, fever, hot flashes, night sweats, weight gain.   Psychological ROS: negative for - anxiety, decreased libido, depression, mood swings, physical abuse or sexual abuse Ophthalmic ROS: negative for - blurry vision, eye pain or loss of vision ENT ROS: negative for - headaches, hearing change, visual changes or vocal changes Allergy and Immunology ROS: negative for - hives, itchy/watery eyes or seasonal allergies Hematological and Lymphatic ROS: negative for - bleeding problems, bruising, swollen lymph nodes or weight loss Endocrine ROS: negative for - galactorrhea, hair pattern  changes, hot flashes, malaise/lethargy, mood swings, palpitations, polydipsia/polyuria, skin changes, temperature intolerance or unexpected weight changes Breast ROS: negative for - new or changing breast lumps or nipple discharge Respiratory ROS: negative for - cough or shortness of breath Cardiovascular ROS: negative for - chest pain, irregular heartbeat, palpitations or shortness of breath Gastrointestinal ROS: no abdominal pain, change in bowel habits, or black or bloody stools Genito-Urinary ROS: no dysuria, trouble voiding, or hematuria Musculoskeletal ROS: negative for - joint pain or joint stiffness Neurological ROS: negative for - bowel and bladder control changes Dermatological ROS: negative for rash and skin lesion changes   Objective:   BP 113/71 (BP Location: Right Arm, Patient Position: Sitting, Cuff Size: Normal)   Pulse 71   Ht 5\' 7"  (1.702 m)   Wt 242 lb (109.8 kg)   BMI 37.90 kg/m  CONSTITUTIONAL: Well-developed, well-nourished female in no acute distress.  PSYCHIATRIC: Normal mood and affect. Normal behavior. Normal judgment and thought content. [place]: Alert and oriented to person, place, and time. Normal muscle tone coordination. No cranial nerve deficit noted. HENT:  Normocephalic, atraumatic, External right and left ear normal. Oropharynx is clear and moist EYES: Conjunctivae and EOM are normal. Pupils are equal, round, and reactive to light. No scleral icterus.  NECK: Normal range of motion, supple, no masses.  Normal thyroid.  SKIN: Skin is warm and dry. No rash noted. Not diaphoretic. No erythema. No pallor. CARDIOVASCULAR: Normal heart rate noted, regular rhythm, no murmur. RESPIRATORY: Clear to auscultation bilaterally. Effort and breath sounds normal, no problems with respiration noted. BREASTS: Symmetric in size. No masses, skin changes, nipple drainage, or lymphadenopathy. Surgical scars well-healed.  ABDOMEN: Soft, normal bowel sounds, no distention  noted.  No tenderness, rebound or guarding.  BLADDER: Normal PELVIC:  Bladder no bladder distension noted  Urethra: normal appearing urethra with no masses, tenderness or lesions  Vulva: normal appearing vulva with no masses, tenderness or lesions  Vagina: atrophic (moderate), no discharge or lesions  Cervix: normal appearing cervix (some stippling) without discharge or lesions  Uterus: uterus is normal size, shape, consistency and nontender  Adnexa: normal adnexa in size, nontender and no masses  RV: External Exam NormaI, No Rectal Masses and Normal Sphincter tone  MUSCULOSKELETAL: Normal range of motion. No tenderness.  No cyanosis, clubbing, or edema.  2+ distal pulses. LYMPHATIC: No Axillary, Supraclavicular, or Inguinal Adenopathy.   Labs:  Other labs reviewed in Care Everywhere,  Lab Results  Component Value Date   WBC 4.6 08/09/2019   HGB 12.0 08/09/2019   HCT 38.1 08/09/2019   MCV 89 08/09/2019   PLT 256 08/09/2019    Lab Results  Component Value Date   CHOL 213 (H) 08/09/2019  HDL 80 08/09/2019   LDLCALC 121 (H) 08/09/2019   TRIG 67 08/09/2019   CHOLHDL 2.4 07/06/2018     Assessment:   Well woman exam with routine gynecological exam Screening for breast cancer Vaginal atrophy Hypothyroidism  Plan:  - Pap: Not needed, up to date.  - Mammogram: Ordered - Stool Guaiac Testing:  Not Ordered.  Patient up to date on colonoscopy. - Labs: Due for labs with Endocrinologist next month. - Routine preventative health maintenance measures emphasized: Exercise/Diet/Weight control, Tobacco Warnings, Alcohol/Substance use risks, Stress Management, Peer Pressure Issues and Safe Sex - Flu vaccine up to date - COVID vaccination series completed.  - Vaginal atrophy, discussed vaginal moisturizers vs local hormonal therapy. Will try moisturizers first.  - Hypothyroidism managed by Endocrinologist.  - Return to [place], [facility]  Care

## 2021-10-13 ENCOUNTER — Encounter: Payer: Self-pay | Admitting: Obstetrics and Gynecology

## 2021-11-08 ENCOUNTER — Encounter: Payer: Self-pay | Admitting: Medical

## 2021-11-08 ENCOUNTER — Ambulatory Visit: Payer: BC Managed Care – PPO | Admitting: Medical

## 2021-11-08 ENCOUNTER — Other Ambulatory Visit: Payer: Self-pay

## 2021-11-08 VITALS — BP 108/68 | HR 85 | Temp 97.4°F | Resp 18

## 2021-11-08 DIAGNOSIS — J40 Bronchitis, not specified as acute or chronic: Secondary | ICD-10-CM

## 2021-11-08 DIAGNOSIS — R051 Acute cough: Secondary | ICD-10-CM

## 2021-11-08 DIAGNOSIS — Z20822 Contact with and (suspected) exposure to covid-19: Secondary | ICD-10-CM

## 2021-11-08 LAB — POC COVID19 BINAXNOW: SARS Coronavirus 2 Ag: NEGATIVE

## 2021-11-08 MED ORDER — PREDNISONE 10 MG (21) PO TBPK: ORAL_TABLET | ORAL | 0 refills | Status: DC

## 2021-11-08 MED ORDER — ALBUTEROL SULFATE HFA 108 (90 BASE) MCG/ACT IN AERS: 2.0000 | INHALATION_SPRAY | Freq: Four times a day (QID) | RESPIRATORY_TRACT | 0 refills | Status: DC | PRN

## 2021-11-08 MED ORDER — DOXYCYCLINE HYCLATE 100 MG PO TABS: 100.0000 mg | ORAL_TABLET | Freq: Two times a day (BID) | ORAL | 0 refills | Status: DC

## 2021-11-08 NOTE — Patient Instructions (Signed)
Cough, Adult Coughing is a reflex that clears your throat and your airways (respiratory system). Coughing helps to heal and protect your lungs. It is normal to cough occasionally, but a cough that happens with other symptoms or lasts a long time may be a sign of a condition that needs treatment. An acute cough may only last 2-3 weeks, while a chronic cough may last 8 or more weeks. Coughing is commonly caused by: Infection of the respiratory systemby viruses or bacteria. Breathing in substances that irritate your lungs. Allergies. Asthma. Mucus that runs down the back of your throat (postnasal drip). Smoking. Acid backing up from the stomach into the esophagus (gastroesophageal reflux). Certain medicines. Chronic lung problems. Other medical conditions such as heart failure or a blood clot in the lung (pulmonary embolism). Follow these instructions at home: Medicines Take over-the-counter and prescription medicines only as told by your health care provider. Talk with your health care provider before you take a cough suppressant medicine. Lifestyle  Avoid cigarette smoke. Do not use any products that contain nicotine or tobacco, such as cigarettes, e-cigarettes, and chewing tobacco. If you need help quitting, ask your health care provider. Drink enough fluid to keep your urine pale yellow. Avoid caffeine. Do not drink alcohol if your health care provider tells you not to drink. General instructions  Pay close attention to changes in your cough. Tell your health care provider about them. Always cover your mouth when you cough. Avoid things that make you cough, such as perfume, candles, cleaning products, or campfire or tobacco smoke. If the air is dry, use a cool mist vaporizer or humidifier in your bedroom or your home to help loosen secretions. If your cough is worse at night, try to sleep in a semi-upright position. Rest as needed. Keep all follow-up visits as told by your health care  provider. This is important. Contact a health care provider if you: Have new symptoms. Cough up pus. Have a cough that does not get better after 2-3 weeks or gets worse. Cannot control your cough with cough suppressant medicines and you are losing sleep. Have pain that gets worse or pain that is not helped with medicine. Have a fever. Have unexplained weight loss. Have night sweats. Get help right away if: You cough up blood. You have difficulty breathing. Your heartbeat is very fast. These symptoms may represent a serious problem that is an emergency. Do not wait to see if the symptoms will go away. Get medical help right away. Call your local emergency services (911 in the [place] Do not drive yourself to the hospital. Summary Coughing is a reflex that clears your throat and your airways. It is normal to cough occasionally, but a cough that happens with other symptoms or lasts a long time may be a sign of a condition that needs treatment. Take over-the-counter and prescription medicines only as told by your health care provider. Always cover your mouth when you cough. Contact a health care provider if you have new symptoms or a cough that does not get better after 2-3 weeks or gets worse. This information is not intended to replace advice given to you by your health care provider. Make sure you discuss any questions you have with your health care provider. Document Revised: 11/05/2018 Document Reviewed: 11/05/2018 Elsevier Patient Education  2022 [employer] Acute Bronchitis, Adult Acute bronchitis is when air tubes in the lungs (bronchi) suddenly get swollen. The condition can make it hard for you to breathe. In adults,  acute bronchitis usually goes away within 2 weeks. A cough caused by bronchitis may last up to 3 weeks. Smoking, allergies, and asthma can make the condition worse. What are the causes? Germs that cause cold and flu (viruses). The most common cause of this condition is  the virus that causes the common cold. Bacteria. Substances that bother (irritate) the lungs, including: Smoke from cigarettes and other types of tobacco. Dust and pollen. Fumes from chemicals, gases, or burned fuel. Indoor or outdoor air pollution. What increases the risk? A weak body's defense system. This is also called the immune system. Any condition that affects your lungs and breathing, such as asthma. What are the signs or symptoms? A cough. Coughing up clear, yellow, or green mucus. Making high-pitched whistling sounds when you breathe, most often when you breathe out (wheezing). Runny or stuffy nose. Having too much mucus in your lungs (chest congestion). Shortness of breath. Body aches. A sore throat. How is this treated? Acute bronchitis may go away over time without treatment. Your doctor may tell you to: Drink more fluids. This will help thin your mucus so it is easier to cough up. Use a device that gets medicine into your lungs (inhaler). Use a vaporizer or a humidifier. These are machines that add water to the air. This helps with coughing and poor breathing. Take a medicine that thins mucus and helps clear it from your lungs. Take a medicine that prevents or stops coughing. It is not common to take an antibiotic medicine for this condition. Follow these instructions at home:  Take over-the-counter and prescription medicines only as told by your doctor. Use an inhaler, vaporizer, or humidifier as told by your doctor. Take two teaspoons (10 mL) of honey at bedtime. This helps lessen your coughing at night. Drink enough fluid to keep your pee (urine) pale yellow. Do not smoke or use any products that contain nicotine or tobacco. If you need help quitting, ask your doctor. Get a lot of rest. Return to your normal activities when your doctor says that it is safe. Keep all follow-up visits. How is this prevented?  Wash your hands often with soap and water for at  least 20 seconds. If you cannot use soap and water, use hand sanitizer. Avoid contact with people who have cold symptoms. Try not to touch your mouth, nose, or eyes with your hands. Avoid breathing in smoke or chemical fumes. Make sure to get the flu shot every year. Contact a doctor if: Your symptoms do not get better in 2 weeks. You have trouble coughing up the mucus. Your cough keeps you awake at night. You have a fever. Get help right away if: You cough up blood. You have chest pain. You have very bad shortness of breath. You faint or keep feeling like you are going to faint. You have a very bad headache. Your fever or chills get worse. These symptoms may be an emergency. Get help right away. Call your local emergency services (911 in the [place] Do not wait to see if the symptoms will go away. Do not drive yourself to the hospital. Summary Acute bronchitis is when air tubes in the lungs (bronchi) suddenly get swollen. In adults, acute bronchitis usually goes away within 2 weeks. Drink more fluids. This will help thin your mucus so it is easier to cough up. Take over-the-counter and prescription medicines only as told by your doctor. Contact a doctor if your symptoms do not improve after 2 weeks of treatment.  This information is not intended to replace advice given to you by your health care provider. Make sure you discuss any questions you have with your health care provider. Document Revised: 02/17/2021 Document Reviewed: 02/17/2021 Elsevier Patient Education  2022 [employer].

## 2021-11-08 NOTE — Progress Notes (Signed)
Subjective:    Patient ID: Carol Davis, female    DOB: January 04, 1956, 66 y.o.   MRN: [id number]  HPI  66 yo female in non acute distress Started on 11/03/2021 coughing and nasal congesstion, sneezing, runny nose, initially with HA, ST those have now resolved. Denies fever or chills, SOB or CP.    Review of Systems  Constitutional:  Positive for chills (first  2 days resolved) and fatigue. Negative for fever.  HENT:  Positive for congestion, postnasal drip, rhinorrhea, sneezing and sore throat (initially). Negative for ear pain, nosebleeds, sinus pressure and sinus pain.   Eyes:  Negative for discharge.  Respiratory:  Positive for cough. Negative for chest tightness, shortness of breath and wheezing.   Cardiovascular:  Negative for chest pain.  Gastrointestinal:  Negative for abdominal pain, diarrhea, nausea and vomiting.  Musculoskeletal:  Negative for myalgias.  Skin:  Negative for color change and rash.  Neurological:  Positive for headaches (initially now resolved). Negative for dizziness, syncope and light-headedness.      Objective:   Physical Exam Constitutional:      Appearance: She is well-developed.  HENT:     Head: Normocephalic and atraumatic.     Jaw: There is normal jaw occlusion.     Right Ear: Hearing normal. A middle ear effusion is present.     Left Ear: Hearing normal. A middle ear effusion is present.     Mouth/Throat:     Mouth: Mucous membranes are moist.     Pharynx: Oropharynx is clear.  Eyes:     Extraocular Movements: Extraocular movements intact.     Pupils: Pupils are equal, round, and reactive to light.  Cardiovascular:     Rate and Rhythm: Normal rate and regular rhythm.     Heart sounds: No murmur heard.   No friction rub. No gallop.  Pulmonary:     Effort: Pulmonary effort is normal.     Breath sounds: Rhonchi present.  Musculoskeletal:     Cervical back: Normal range of motion and neck supple.  Lymphadenopathy:     Cervical: No cervical  adenopathy.  Skin:    General: Skin is warm and dry.  Neurological:     Mental Status: She is alert.     GCS: GCS eye subscore is 4. GCS verbal subscore is 5. GCS motor subscore is 6.  Psychiatric:        Mood and Affect: Mood normal.        Speech: Speech normal.        Behavior: Behavior normal.  Coughing a lot in room        Assessment & Plan:  Bronlchitis Bronchospasm/ cough Meds ordered this encounter  Medications   doxycycline (VIBRA-TABS) 100 MG tablet    Sig: Take 1 tablet (100 mg total) by mouth 2 (two) times daily.    Dispense:  14 tablet    Refill:  0   albuterol (VENTOLIN HFA) 108 (90 Base) MCG/ACT inhaler    Sig: Inhale 2 puffs into the lungs every 6 (six) hours as needed for wheezing or shortness of breath.    Dispense:  8 g    Refill:  0   predniSONE (STERAPRED UNI-PAK 21 TAB) 10 MG (21) TBPK tablet    Sig: Take 6 tablets by mouth today then 5 tablets tomorrow then one less tablet every day thereafter. Take with food.    Dispense:  21 tablet    Refill:  0   Tussionex 27ml q12  hours prn cough #30cc no RX. Written prescription. Rest, increase fluids, OTC Motrin or Tylenol for fever or if not if feeling well. Patient verbalizes understanding and has no questions at discharge. Work note sent to patient.

## 2022-07-08 ENCOUNTER — Other Ambulatory Visit: Payer: Self-pay | Admitting: Nurse Practitioner

## 2022-07-08 DIAGNOSIS — R051 Acute cough: Secondary | ICD-10-CM

## 2022-07-08 MED ORDER — BENZONATATE 100 MG PO CAPS: 100.0000 mg | ORAL_CAPSULE | Freq: Three times a day (TID) | ORAL | 0 refills | Status: AC | PRN

## 2022-07-08 NOTE — Progress Notes (Signed)
Patient called requesting a refill on benzonatate cough medication. She has been recovering from COVID, denies fever or productive cough.   Overall feeling better but continues to have dry cough that benzonatate has been helping with    Meds ordered this encounter  Medications   benzonatate (TESSALON) 100 MG capsule    Sig: Take 1 capsule (100 mg total) by mouth 3 (three) times daily as needed for cough.    Dispense:  30 capsule    Refill:  0

## 2022-09-29 ENCOUNTER — Ambulatory Visit (INDEPENDENT_AMBULATORY_CARE_PROVIDER_SITE_OTHER): Payer: BC Managed Care – PPO | Admitting: Obstetrics and Gynecology

## 2022-09-29 ENCOUNTER — Encounter: Payer: Self-pay | Admitting: Obstetrics and Gynecology

## 2022-09-29 VITALS — BP 129/80 | HR 69 | Ht 67.5 in | Wt 232.1 lb

## 2022-09-29 DIAGNOSIS — Z01419 Encounter for gynecological examination (general) (routine) without abnormal findings: Secondary | ICD-10-CM

## 2022-09-29 DIAGNOSIS — Z780 Asymptomatic menopausal state: Secondary | ICD-10-CM

## 2022-09-29 DIAGNOSIS — Z13220 Encounter for screening for lipoid disorders: Secondary | ICD-10-CM | POA: Diagnosis not present

## 2022-09-29 DIAGNOSIS — E039 Hypothyroidism, unspecified: Secondary | ICD-10-CM

## 2022-09-29 DIAGNOSIS — Z131 Encounter for screening for diabetes mellitus: Secondary | ICD-10-CM

## 2022-09-29 DIAGNOSIS — Z1231 Encounter for screening mammogram for malignant neoplasm of breast: Secondary | ICD-10-CM

## 2022-09-29 NOTE — Patient Instructions (Signed)
Breast Self-Awareness Breast self-awareness is knowing how your breasts look and feel. You need to: Check your breasts on a regular basis. Tell your doctor about any changes. Become familiar with the look and feel of your breasts. This can help you catch a breast problem while it is still small and can be treated. You should do breast self-exams even if you have breast implants. What you need: A mirror. A well-lit room. A pillow or other soft object. How to do a breast self-exam Follow these steps to do a breast self-exam: Look for changes  Take off all the clothes above your waist. Stand in front of a mirror in a room with good lighting. Put your hands down at your sides. Compare your breasts in the mirror. Look for any difference between them, such as: A difference in shape. A difference in size. Wrinkles, dips, and bumps in one breast and not the other. Look at each breast for changes in the skin, such as: Redness. Scaly areas. Skin that has gotten thicker. Dimpling. Open sores (ulcers). Look for changes in your nipples, such as: Fluid coming out of a nipple. Fluid around a nipple. Bleeding. Dimpling. Redness. A nipple that looks pushed in (retracted), or that has changed position. Feel for changes Lie on your back. Feel each breast. To do this: Pick a breast to feel. Place a pillow under the shoulder closest to that breast. Put the arm closest to that breast behind your head. Feel the nipple area of that breast using the hand of your other arm. Feel the area with the pads of your three middle fingers by making small circles with your fingers. Use light, medium, and firm pressure. Continue the overlapping circles, moving downward over the breast. Keep making circles with your fingers. Stop when you feel your ribs. Start making circles with your fingers again, this time going upward until you reach your collarbone. Then, make circles outward across your breast and into your  armpit area. Squeeze your nipple. Check for discharge and lumps. Repeat these steps to check your other breast. Sit or stand in the tub or shower. With soapy water on your skin, feel each breast the same way you did when you were lying down. Write down what you find Writing down what you find can help you remember what to tell your doctor. Write down: What is normal for each breast. Any changes you find in each breast. These include: The kind of changes you find. A tender or painful breast. Any lump you find. Write down its size and where it is. When you last had your monthly period (menstrual cycle). General tips If you are breastfeeding, the best time to check your breasts is after you feed your baby or after you use a breast pump. If you get monthly bleeding, the best time to check your breasts is 5-7 days after your monthly cycle ends. With time, you will become comfortable with the self-exam. You will also start to know if there are changes in your breasts. Contact a doctor if: You see a change in the shape or size of your breasts or nipples. You see a change in the skin of your breast or nipples, such as red or scaly skin. You have fluid coming from your nipples that is not normal. You find a new lump or thick area. You have breast pain. You have any concerns about your breast health. Summary Breast self-awareness includes looking for changes in your breasts and feeling for changes   within your breasts. You should do breast self-awareness in front of a mirror in a well-lit room. If you get monthly periods (menstrual cycles), the best time to check your breasts is 5-7 days after your period ends. Tell your doctor about any changes you see in your breasts. Changes include changes in size, changes on the skin, painful or tender breasts, or fluid from your nipples that is not normal. This information is not intended to replace advice given to you by your health care provider. Make sure  you discuss any questions you have with your health care provider. Document Revised: 03/24/2022 Document Reviewed: 08/19/2021 Elsevier Patient Education  [place] 65 Years and Older, Female Preventive care refers to lifestyle choices and visits with your health care provider that can promote health and wellness. Preventive care visits are also called wellness exams. What can I expect for my preventive care visit? Counseling Your health care provider may ask you questions about your: Medical history, including: Past medical problems. Family medical history. Pregnancy and menstrual history. History of falls. Current health, including: Memory and ability to understand (cognition). Emotional well-being. Home life and relationship well-being. Sexual activity and sexual health. Lifestyle, including: Alcohol, nicotine or tobacco, and drug use. Access to firearms. Diet, exercise, and sleep habits. Work and work Statistician. Sunscreen use. Safety issues such as seatbelt and bike helmet use. Physical exam Your health care provider will check your: Height and weight. These may be used to calculate your BMI (body mass index). BMI is a measurement that tells if you are at a healthy weight. Waist circumference. This measures the distance around your waistline. This measurement also tells if you are at a healthy weight and may help predict your risk of certain diseases, such as type 2 diabetes and high blood pressure. Heart rate and blood pressure. Body temperature. Skin for abnormal spots. What immunizations do I need?  Vaccines are usually given at various ages, according to a schedule. Your health care provider will recommend vaccines for you based on your age, medical history, and lifestyle or other factors, such as travel or where you work. What tests do I need? Screening Your health care provider may recommend screening tests for certain conditions. This may  include: Lipid and cholesterol levels. Hepatitis C test. Hepatitis B test. HIV (human immunodeficiency virus) test. STI (sexually transmitted infection) testing, if you are at risk. Lung cancer screening. Colorectal cancer screening. Diabetes screening. This is done by checking your blood sugar (glucose) after you have not eaten for a while (fasting). Mammogram. Talk with your health care provider about how often you should have regular mammograms. BRCA-related cancer screening. This may be done if you have a family history of breast, ovarian, tubal, or peritoneal cancers. Bone density scan. This is done to screen for osteoporosis. Talk with your health care provider about your test results, treatment options, and if necessary, the need for more tests. Follow these instructions at home: Eating and drinking  Eat a diet that includes fresh fruits and vegetables, whole grains, lean protein, and low-fat dairy products. Limit your intake of foods with high amounts of sugar, saturated fats, and salt. Take vitamin and mineral supplements as recommended by your health care provider. Do not drink alcohol if your health care provider tells you not to drink. If you drink alcohol: Limit how much you have to 0-1 drink a day. Know how much alcohol is in your drink. In the [place] one drink equals one 12 oz  bottle of beer (355 mL), one 5 oz glass of wine (148 mL), or one 1 oz glass of hard liquor (44 mL). Lifestyle Brush your teeth every morning and night with fluoride toothpaste. Floss one time each day. Exercise for at least 30 minutes 5 or more days each week. Do not use any products that contain nicotine or tobacco. These products include cigarettes, chewing tobacco, and vaping devices, such as e-cigarettes. If you need help quitting, ask your health care provider. Do not use drugs. If you are sexually active, practice safe sex. Use a condom or other form of protection in order to prevent STIs. Take  aspirin only as told by your health care provider. Make sure that you understand how much to take and what form to take. Work with your health care provider to find out whether it is safe and beneficial for you to take aspirin daily. Ask your health care provider if you need to take a cholesterol-lowering medicine (statin). Find healthy ways to manage stress, such as: Meditation, yoga, or listening to music. Journaling. Talking to a trusted person. Spending time with friends and family. Minimize exposure to UV radiation to reduce your risk of skin cancer. Safety Always wear your seat belt while driving or riding in a vehicle. Do not drive: If you have been drinking alcohol. Do not ride with someone who has been drinking. When you are tired or distracted. While texting. If you have been using any mind-altering substances or drugs. Wear a helmet and other protective equipment during sports activities. If you have firearms in your house, make sure you follow all gun safety procedures. What's next? Visit your health care provider once a year for an annual wellness visit. Ask your health care provider how often you should have your eyes and teeth checked. Stay up to date on all vaccines. This information is not intended to replace advice given to you by your health care provider. Make sure you discuss any questions you have with your health care provider. Document Revised: 04/14/2021 Document Reviewed: 04/14/2021 Elsevier Patient Education  [place].

## 2022-09-29 NOTE — Progress Notes (Signed)
ANNUAL PREVENTATIVE CARE GYNECOLOGY  ENCOUNTER NOTE  Subjective:       Carol Davis is a 66 y.o. G0P0000 female here for a routine annual gynecologic exam. The patient is not sexually active. The patient is not taking hormone replacement therapy. Patient denies post-menopausal vaginal bleeding. The patient wears seatbelts: yes. The patient participates in regular exercise: yes. Has the patient ever been transfused or tattooed?: no. The patient reports that there is not domestic violence in her life.  Current complaints: 1.  No new concerns today. Notes that she plans on retiring in 1 year.    Gynecologic History No LMP recorded. Patient is postmenopausal. Contraception: post menopausal status Last Pap: 07/14/2020. Results were: normal Last mammogram: 03/23/2020. Results were: normal Last Colonoscopy: 02/25/2021: Every 5 years Last Dexa Scan: Patient has never had one.    Obstetric History OB History  Gravida Para Term Preterm AB Living  0 0 0 0 0 0  SAB IAB Ectopic Multiple Live Births  0 0 0 0 0    Past Medical History:  Diagnosis Date   Anemia    IDA   HSV-2 (herpes simplex virus 2) infection    Leucopenia    STD (sexually transmitted disease)    pos gonorrhea   Thyroid disease     Family History  Problem Relation Age of Onset   Diabetes Mother    Diabetes Father    Diabetes Brother    Breast cancer Neg Hx    Ovarian cancer Neg Hx    Colon cancer Neg Hx    Heart disease Neg Hx     Past Surgical History:  Procedure Laterality Date   ANUS SURGERY  6-10 years ago   AUGMENTATION MAMMAPLASTY  10/2016   removed    BREAST IMPLANT REMOVAL Bilateral    COLONOSCOPY  within last 3 years   TOTAL HIP ARTHROPLASTY Left     Social History   Socioeconomic History   Marital status: Single    Spouse name: Not on file   Number of children: Not on file   Years of education: Not on file   Highest education level: Not on file  Occupational History   Not on file   Tobacco Use   Smoking status: Former    Types: Cigarettes    Quit date: 02/29/1988    Years since quitting: 34.6   Smokeless tobacco: Never   Tobacco comments:    quit 30 years ago  Vaping Use   Vaping Use: Never used  Substance and Sexual Activity   Alcohol use: Not Currently   Drug use: Not Currently   Sexual activity: Not Currently    Partners: Male    Birth control/protection: Post-menopausal  Other Topics Concern   Not on file  Social History Narrative   Not on file   Social Determinants of Health   Financial Resource Strain: Not on file  Food Insecurity: Not on file  Transportation Needs: Not on file  Physical Activity: Insufficiently Active (06/21/2018)   Exercise Vital Sign    Days of Exercise per Week: 2 days    Minutes of Exercise per Session: 60 min  Stress: Not on file  Social Connections: Not on file  Intimate Partner Violence: Not on file    Current Outpatient Medications on File Prior to Visit  Medication Sig Dispense Refill   hydrocortisone (ANUSOL-HC) 25 MG suppository Place 25 mg rectally 2 (two) times daily as needed for hemorrhoids.     levothyroxine (SYNTHROID)  112 MCG tablet Take 112 mcg by mouth daily before breakfast.     No current facility-administered medications on file prior to visit.    Allergies  Allergen Reactions   Other Dermatitis    dermabond adhesive        Review of Systems ROS Review of Systems - General ROS: negative for - chills, fatigue, fever, hot flashes, night sweats, weight gain or weight loss Psychological ROS: negative for - anxiety, decreased libido, depression, mood swings, physical abuse or sexual abuse Ophthalmic ROS: negative for - blurry vision, eye pain or loss of vision ENT ROS: negative for - headaches, hearing change, visual changes or vocal changes Allergy and Immunology ROS: negative for - hives, itchy/watery eyes or seasonal allergies Hematological and Lymphatic ROS: negative for - bleeding  problems, bruising, swollen lymph nodes or weight loss Endocrine ROS: negative for - galactorrhea, hair pattern changes, hot flashes, malaise/lethargy, mood swings, palpitations, polydipsia/polyuria, skin changes, temperature intolerance or unexpected weight changes Breast ROS: negative for - new or changing breast lumps or nipple discharge Respiratory ROS: negative for - cough or shortness of breath Cardiovascular ROS: negative for - chest pain, irregular heartbeat, palpitations or shortness of breath Gastrointestinal ROS: no abdominal pain, change in bowel habits, or black or bloody stools Genito-Urinary ROS: no dysuria, trouble voiding, or hematuria Musculoskeletal ROS: negative for - joint pain or joint stiffness Neurological ROS: negative for - bowel and bladder control changes Dermatological ROS: negative for rash and skin lesion changes   Objective:   Blood pressure 129/80, pulse 69, height 5' 7.5" (1.715 m), weight 232 lb 2 oz (105.3 kg).  Body mass index is 35.82 kg/m.  CONSTITUTIONAL: Well-developed, well-nourished female in no acute distress.  PSYCHIATRIC: Normal mood and affect. Normal behavior. Normal judgment and thought content. NEUROLGIC: Alert and oriented to person, place, and time. Normal muscle tone coordination. No cranial nerve deficit noted. HENT:  Normocephalic, atraumatic, External right and left ear normal. Oropharynx is clear and moist EYES: Conjunctivae and EOM are normal. Pupils are equal, round, and reactive to light. No scleral icterus.  NECK: Normal range of motion, supple, no masses.  Normal thyroid.  SKIN: Skin is warm and dry. No rash noted. Not diaphoretic. No erythema. No pallor. CARDIOVASCULAR: Normal heart rate noted, regular rhythm, no murmur. RESPIRATORY: Clear to auscultation bilaterally. Effort and breath sounds normal, no problems with respiration noted. BREASTS: Symmetric in size. No masses, skin changes, nipple drainage, or  lymphadenopathy. ABDOMEN: Soft, normal bowel sounds, no distention noted.  No tenderness, rebound or guarding.  BLADDER: Normal PELVIC:  Bladder no bladder distension noted  Urethra: normal appearing urethra with no masses, tenderness or lesions  Vulva: normal appearing vulva with no masses, tenderness or lesions  Vagina: atrophic (mild), no lesions or discharge  Cervix: normal appearing cervix without discharge or lesions  Uterus: uterus is normal size, shape, consistency and nontender  Adnexa: normal adnexa in size, nontender and no masses  RV:  Anal skin tag present, and Normal Sphincter tone  MUSCULOSKELETAL: Normal range of motion. No tenderness.  No cyanosis, clubbing, or edema.  2+ distal pulses. LYMPHATIC: No Axillary, Supraclavicular, or Inguinal Adenopathy.   Labs: Lab Results  Component Value Date   WBC 4.6 08/09/2019   HGB 12.0 08/09/2019   HCT 38.1 08/09/2019   MCV 89 08/09/2019   PLT 256 08/09/2019    Lab Results  Component Value Date   CREATININE 0.68 02/27/2018   BUN 16 02/27/2018   NA 143 02/27/2018  K 4.7 02/27/2018   CL 106 02/27/2018   CO2 24 02/27/2018    Lab Results  Component Value Date   ALT 11 02/27/2018   AST 15 02/27/2018   ALKPHOS 53 02/27/2018   BILITOT 0.3 02/27/2018    Lab Results  Component Value Date   CHOL 213 (H) 08/09/2019   HDL 80 08/09/2019   LDLCALC 121 (H) 08/09/2019   TRIG 67 08/09/2019   CHOLHDL 2.4 07/06/2018    Lab Results  Component Value Date   TSH 3.350 01/22/2020    Lab Results  Component Value Date   HGBA1C 5.0 07/06/2018     Assessment:   1. Well woman exam with routine gynecological exam   2. Breast cancer screening by mammogram      Plan:  Pap: Not needed. Patient has aged out of screening.  Mammogram: Ordered Colon Screening:   UTD Labs: Lipid 1, FBS, TSH, Hemoglobin A1C, and Vit D Level""CBC ,CMET Routine preventative health maintenance measures emphasized: Exercise/Diet/Weight control,  Tobacco Warnings, Alcohol/Substance use risks, Stress Management.   Encouraged Vitamin D and Calcium supplementation Colonoscopy: UTD  COVID Vaccination status: Received 2 doses of Moderna. Is eligible for booster.  Encourage vaccinations with pneumonia and Shingles.  Flu Vaccine: Done at work Return to [place], [place]

## 2022-09-30 LAB — COMPREHENSIVE METABOLIC PANEL
ALT: 24 IU/L (ref 0–32)
AST: 26 IU/L (ref 0–40)
Albumin/Globulin Ratio: 1.8 (ref 1.2–2.2)
Albumin: 4.2 g/dL (ref 3.9–4.9)
Alkaline Phosphatase: 93 IU/L (ref 44–121)
BUN/Creatinine Ratio: 23 (ref 12–28)
BUN: 16 mg/dL (ref 8–27)
Bilirubin Total: 0.3 mg/dL (ref 0.0–1.2)
CO2: 23 mmol/L (ref 20–29)
Calcium: 8.9 mg/dL (ref 8.7–10.3)
Chloride: 102 mmol/L (ref 96–106)
Creatinine, Ser: 0.69 mg/dL (ref 0.57–1.00)
Globulin, Total: 2.3 g/dL (ref 1.5–4.5)
Glucose: 83 mg/dL (ref 70–99)
Potassium: 4.6 mmol/L (ref 3.5–5.2)
Sodium: 138 mmol/L (ref 134–144)
Total Protein: 6.5 g/dL (ref 6.0–8.5)
eGFR: 96 mL/min/{1.73_m2} (ref >59)

## 2022-09-30 LAB — CBC
Hematocrit: 38.7 % (ref 34.0–46.6)
Hemoglobin: 12.9 g/dL (ref 11.1–15.9)
MCH: 31 pg (ref 26.6–33.0)
MCHC: 33.3 g/dL (ref 31.5–35.7)
MCV: 93 fL (ref 79–97)
Platelets: 248 10*3/uL (ref 150–450)
RBC: 4.16 x10E6/uL (ref 3.77–5.28)
RDW: 12.6 % (ref 11.7–15.4)
WBC: 4.4 10*3/uL (ref 3.4–10.8)

## 2022-09-30 LAB — LIPID PANEL
Chol/HDL Ratio: 2.6 ratio (ref 0.0–4.4)
Cholesterol, Total: 217 mg/dL — ABNORMAL HIGH (ref 100–199)
HDL: 82 mg/dL (ref >39)
LDL Chol Calc (NIH): 126 mg/dL — ABNORMAL HIGH (ref 0–99)
Triglycerides: 53 mg/dL (ref 0–149)
VLDL Cholesterol Cal: 9 mg/dL (ref 5–40)

## 2022-09-30 LAB — VITAMIN D 25 HYDROXY (VIT D DEFICIENCY, FRACTURES): Vit D, 25-Hydroxy: 23.9 ng/mL — ABNORMAL LOW (ref 30.0–100.0)

## 2022-09-30 LAB — HEMOGLOBIN A1C
Est. average glucose Bld gHb Est-mCnc: 103 mg/dL
Hgb A1c MFr Bld: 5.2 % (ref 4.8–5.6)

## 2022-09-30 LAB — TSH: TSH: 7.02 u[IU]/mL — ABNORMAL HIGH (ref 0.450–4.500)

## 2022-09-30 NOTE — Addendum Note (Signed)
Addended by: Fabian November on: 09/30/2022 10:07 PM   Modules accepted: Orders

## 2022-11-04 ENCOUNTER — Encounter: Payer: Self-pay | Admitting: Adult Health

## 2022-11-04 ENCOUNTER — Ambulatory Visit (INDEPENDENT_AMBULATORY_CARE_PROVIDER_SITE_OTHER): Payer: Self-pay | Admitting: Adult Health

## 2022-11-04 VITALS — BP 124/80 | HR 80 | Temp 98.2°F | Wt 237.6 lb

## 2022-11-04 DIAGNOSIS — Z20822 Contact with and (suspected) exposure to covid-19: Secondary | ICD-10-CM

## 2022-11-04 LAB — POC COVID19 BINAXNOW: SARS Coronavirus 2 Ag: NEGATIVE

## 2022-11-04 NOTE — Progress Notes (Signed)
[facility] [address], [place] [address]   Office Visit Note  Patient Name: Carol Davis Date of Birth [id number]  Medical Record number [id number]  Date of Service: 11/04/2022  Chief Complaint  Patient presents with   Covid Exposure    Exposed Wed morning. No symptoms      HPI Pt is here for acute visit.  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 She is interested being tested.  She is not having any symptoms.    Current Medication:  Outpatient Encounter Medications as of 11/04/2022  Medication Sig   levothyroxine (SYNTHROID) 112 MCG tablet Take 112 mcg by mouth daily before breakfast.   hydrocortisone (ANUSOL-HC) 25 MG suppository Place 25 mg rectally 2 (two) times daily as needed for hemorrhoids.   No facility-administered encounter medications on file as of 11/04/2022.      Medical History: Past Medical History:  Diagnosis Date   Anemia    IDA   HSV-2 (herpes simplex virus 2) infection    Leucopenia    STD (sexually transmitted disease)    pos gonorrhea   Thyroid disease      Vital Signs: BP 124/80 (BP Location: Left Arm, Patient Position: Sitting, Cuff Size: Normal)   Pulse 80   Temp 98.2 F (36.8 C) (Tympanic)   Wt 237 lb 9.6 oz (107.8 kg)   SpO2 100%   BMI 36.66 kg/m    Review of Systems  Constitutional:  Negative for chills, fatigue and fever.    Physical Exam Vitals and nursing note reviewed.  Constitutional:      Appearance: Normal appearance.  Neurological:     Mental Status: She is alert.    Assessment/Plan: 1. Close exposure to COVID-19 virus Negative test at this time.  Discussed if symptoms develop she should retest. Follow up as needed.      General Counseling: janell keeling understanding of the findings of todays visit and agrees with plan of treatment. I have discussed any further diagnostic evaluation that may be needed or ordered today. We also reviewed her  medications today. she has been encouraged to call the office with any questions or concerns that should arise related to todays visit.   Orders Placed This Encounter  Procedures   POC COVID-19    No orders of the defined types were placed in this encounter.   Time spent:20 Minutes    Kendell Bane AGNP-C Nurse Practitioner

## 2022-11-17 ENCOUNTER — Ambulatory Visit: Payer: Self-pay

## 2022-12-22 ENCOUNTER — Ambulatory Visit: Payer: Self-pay

## 2023-02-06 ENCOUNTER — Ambulatory Visit (INDEPENDENT_AMBULATORY_CARE_PROVIDER_SITE_OTHER): Payer: Self-pay | Admitting: Physician Assistant

## 2023-02-06 VITALS — BP 130/80 | HR 84 | Temp 97.3°F | Resp 16

## 2023-02-06 DIAGNOSIS — J069 Acute upper respiratory infection, unspecified: Secondary | ICD-10-CM

## 2023-02-06 MED ORDER — BENZONATATE 200 MG PO CAPS: 200.0000 mg | ORAL_CAPSULE | Freq: Three times a day (TID) | ORAL | 0 refills | Status: DC | PRN

## 2023-02-06 NOTE — Progress Notes (Signed)
[facility] [address] [place], [place] [address]   Office Visit Note  Patient Name: Carol Davis Date of Birth [id number]  Medical Record number [id number]  Date of Service: 02/06/2023  Chief Complaint  Patient presents with   Cough     67 y/o F presents to the clinic for c/o cough, chest congestion, post nasal drainage x few days. Pt tested positive for flu last week and was feeling better afterwards. She was gardening over the weekend and felt worse with all the symptoms stated above. Denies CP, SOB, fever, or chills. Taking Zyrtec and Mucinex.  Cough Associated symptoms include postnasal drip. Pertinent negatives include no sore throat, shortness of breath or wheezing.      Current Medication:  Outpatient Encounter Medications as of 02/06/2023  Medication Sig   benzonatate (TESSALON) 200 MG capsule Take 1 capsule (200 mg total) by mouth 3 (three) times daily as needed for cough.   hydrocortisone (ANUSOL-HC) 25 MG suppository Place 25 mg rectally 2 (two) times daily as needed for hemorrhoids.   levothyroxine (SYNTHROID) 112 MCG tablet Take 112 mcg by mouth daily before breakfast.   No facility-administered encounter medications on file as of 02/06/2023.      Medical History: Past Medical History:  Diagnosis Date   Anemia    IDA   HSV-2 (herpes simplex virus 2) infection    Leucopenia    STD (sexually transmitted disease)    pos gonorrhea   Thyroid disease      Vital Signs: BP 130/80 (BP Location: Left Arm, Patient Position: Sitting, Cuff Size: Normal)   Pulse 84   Temp (!) 97.3 F (36.3 C) (Tympanic)   Resp 16   SpO2 98%    Review of Systems  Constitutional: Negative.   HENT:  Positive for congestion (chest) and postnasal drip. Negative for sinus pressure, sinus pain, sore throat and trouble swallowing.   Respiratory:  Positive for cough. Negative for chest tightness, shortness of breath and wheezing.   Cardiovascular: Negative.    Neurological: Negative.     Physical Exam Constitutional:      Appearance: Normal appearance.  HENT:     Head: Atraumatic.     Right Ear: Tympanic membrane, ear canal and external ear normal.     Left Ear: Tympanic membrane, ear canal and external ear normal.     Nose: Nose normal.     Mouth/Throat:     Mouth: Mucous membranes are moist.     Pharynx: Oropharynx is clear.  Eyes:     Extraocular Movements: Extraocular movements intact.  Cardiovascular:     Rate and Rhythm: Normal rate and regular rhythm.  Pulmonary:     Effort: Pulmonary effort is normal.     Breath sounds: Normal breath sounds.  Musculoskeletal:     Cervical back: Neck supple.  Skin:    General: Skin is warm.  Neurological:     Mental Status: She is alert.  Psychiatric:        Mood and Affect: Mood normal.        Behavior: Behavior normal.        Thought Content: Thought content normal.        Judgment: Judgment normal.       Assessment/Plan:  1. Viral upper respiratory tract infection - benzonatate (TESSALON) 200 MG capsule; Take 1 capsule (200 mg total) by mouth 3 (three) times daily as needed for cough.  Dispense: 30 capsule; Refill: 0  Increase fluids Continue with  Zyrtec at night and add Claritin during the daytime.  Use flonase nasal spray as directed on the bottle. Take cough suppressant medicine as prescribed Continue to watch for worsening symptoms RTC if symptoms don't improve or worsen.  Pt verbalized understanding and in agreement.    General Counseling: athyna lovato understanding of the findings of todays visit and agrees with plan of treatment. I have discussed any further diagnostic evaluation that may be needed or ordered today. We also reviewed her medications today. she has been encouraged to call the office with any questions or concerns that should arise related to todays visit.    Time spent:20 Minutes    Gilberto Better, [place] Physician Assistant

## 2023-02-13 ENCOUNTER — Ambulatory Visit (INDEPENDENT_AMBULATORY_CARE_PROVIDER_SITE_OTHER): Payer: Self-pay | Admitting: Physician Assistant

## 2023-02-13 ENCOUNTER — Other Ambulatory Visit: Payer: Self-pay

## 2023-02-13 VITALS — BP 118/76 | HR 93 | Temp 98.1°F

## 2023-02-13 DIAGNOSIS — J069 Acute upper respiratory infection, unspecified: Secondary | ICD-10-CM

## 2023-02-13 DIAGNOSIS — J9801 Acute bronchospasm: Secondary | ICD-10-CM

## 2023-02-13 MED ORDER — GUAIFENESIN-CODEINE 100-10 MG/5ML PO SYRP: 10.0000 mL | ORAL_SOLUTION | Freq: Every evening | ORAL | 0 refills | Status: DC | PRN

## 2023-02-13 MED ORDER — PSEUDOEPHEDRINE HCL ER 120 MG PO TB12: 120.0000 mg | ORAL_TABLET | Freq: Two times a day (BID) | ORAL | 0 refills | Status: DC

## 2023-02-13 NOTE — Progress Notes (Signed)
[facility] [address] [place], [place] [address]   Office Visit Note  Patient Name: Carol Davis Date of Birth [id number]  Medical Record number [id number]  Date of Service: 02/13/2023  Chief Complaint  Patient presents with   Cough   Nasal Congestion     67 y/o F presents to the clinic for c/o URI symptoms. +cough and congestion. Cough throughout the day, but worse at night. Has been taking Tessalon, Claritin, Allegra and Flonase without some relief, but cough not improving. Complaint with Tessalon Rx. Denies CP, wheezing, SOB, or fever.   Cough Associated symptoms include postnasal drip. Pertinent negatives include no sore throat, shortness of breath or wheezing.      Current Medication:  Outpatient Encounter Medications as of 02/13/2023  Medication Sig   benzonatate (TESSALON) 200 MG capsule Take 1 capsule (200 mg total) by mouth 3 (three) times daily as needed for cough.   fexofenadine (ALLEGRA) 60 MG tablet Take 60 mg by mouth 2 (two) times daily.   guaiFENesin-codeine (ROBITUSSIN AC) 100-10 MG/5ML syrup Take 10 mLs by mouth at bedtime as needed for cough or congestion.   levothyroxine (SYNTHROID) 112 MCG tablet Take 112 mcg by mouth daily before breakfast.   loratadine (CLARITIN) 10 MG tablet Take 10 mg by mouth daily.   pseudoephedrine (SUDAFED) 120 MG 12 hr tablet Take 1 tablet (120 mg total) by mouth 2 (two) times daily.   hydrocortisone (ANUSOL-HC) 25 MG suppository Place 25 mg rectally 2 (two) times daily as needed for hemorrhoids. (Patient not taking: Reported on 02/13/2023)   No facility-administered encounter medications on file as of 02/13/2023.      Medical History: Past Medical History:  Diagnosis Date   Anemia    IDA   HSV-2 (herpes simplex virus 2) infection    Leucopenia    STD (sexually transmitted disease)    pos gonorrhea   Thyroid disease      Vital Signs: BP 118/76 (BP Location: Left Arm, Patient Position: Sitting)   Pulse  93   Temp 98.1 F (36.7 C) (Tympanic)   SpO2 96%    Review of Systems  Constitutional: Negative.   HENT:  Positive for congestion, postnasal drip and sneezing. Negative for sinus pressure, sinus pain, sore throat and trouble swallowing.   Respiratory:  Positive for cough. Negative for chest tightness, shortness of breath and wheezing.   Cardiovascular: Negative.     Physical Exam Constitutional:      Appearance: Normal appearance.  HENT:     Head: Atraumatic.     Right Ear: Tympanic membrane, ear canal and external ear normal.     Left Ear: Tympanic membrane, ear canal and external ear normal.     Nose: Nose normal.     Mouth/Throat:     Mouth: Mucous membranes are moist.     Pharynx: Oropharynx is clear.  Eyes:     Extraocular Movements: Extraocular movements intact.  Cardiovascular:     Rate and Rhythm: Normal rate and regular rhythm.  Pulmonary:     Effort: Pulmonary effort is normal.     Breath sounds: Normal breath sounds.  Musculoskeletal:     Cervical back: Neck supple.  Skin:    General: Skin is warm.  Neurological:     Mental Status: She is alert.  Psychiatric:        Mood and Affect: Mood normal.        Behavior: Behavior normal.  Thought Content: Thought content normal.        Judgment: Judgment normal.       Assessment/Plan:  1. Viral upper respiratory tract infection - pseudoephedrine (SUDAFED) 120 MG 12 hr tablet; Take 1 tablet (120 mg total) by mouth 2 (two) times daily.  Dispense: 20 tablet; Refill: 0  2. Bronchospasm - guaiFENesin-codeine (ROBITUSSIN AC) 100-10 MG/5ML syrup; Take 10 mLs by mouth at bedtime as needed for cough or congestion.  Dispense: 120 mL; Refill: 0  Increase fluids Start oral decongestant as prescribed.  Take cough syrup at bedtime only if needed.  Continue with oral anti-histamines and nasal spray If symptoms worsen then will consider oral steroids. Pt verbalized understanding and in agreement.   General  Counseling: temprence body understanding of the findings of todays visit and agrees with plan of treatment. I have discussed any further diagnostic evaluation that may be needed or ordered today. We also reviewed her medications today. she has been encouraged to call the office with any questions or concerns that should arise related to todays visit.    Time spent:20 Minutes    Gilberto Better, [place] Physician Assistant

## 2023-04-13 ENCOUNTER — Ambulatory Visit
Admission: RE | Admit: 2023-04-13 | Discharge: 2023-04-13 | Disposition: A | Discharge status: Home / Self Care | Payer: BC Managed Care – PPO | Source: Ambulatory Visit | Attending: Obstetrics and Gynecology | Admitting: Obstetrics and Gynecology

## 2023-04-13 DIAGNOSIS — Z01419 Encounter for gynecological examination (general) (routine) without abnormal findings: Secondary | ICD-10-CM | POA: Diagnosis not present

## 2023-04-13 DIAGNOSIS — Z1231 Encounter for screening mammogram for malignant neoplasm of breast: Secondary | ICD-10-CM | POA: Insufficient documentation

## 2023-04-13 DIAGNOSIS — Z780 Asymptomatic menopausal state: Secondary | ICD-10-CM | POA: Insufficient documentation

## 2023-04-25 ENCOUNTER — Ambulatory Visit: Payer: BC Managed Care – PPO | Admitting: Podiatry

## 2023-04-25 DIAGNOSIS — L600 Ingrowing nail: Secondary | ICD-10-CM

## 2023-04-25 NOTE — Patient Instructions (Signed)

## 2023-04-25 NOTE — Progress Notes (Signed)
   Chief Complaint  Patient presents with   Ingrown Toenail    Left foot ingrown toenail, hallux, lateral border, started 2 weeks ago, some drainage     Subjective: Patient presents today for evaluation of pain to the lateral border left great toe. Patient is concerned for possible ingrown nail.  It is very sensitive to touch.  Patient presents today for further treatment and evaluation.  Past Medical History:  Diagnosis Date   Anemia    IDA   HSV-2 (herpes simplex virus 2) infection    Leucopenia    STD (sexually transmitted disease)    pos gonorrhea   Thyroid disease     Objective:  General: Well developed, nourished, in no acute distress, alert and oriented x3   Dermatology: Skin is warm, dry and supple bilateral.  Lateral border left great toe is tender with evidence of an ingrowing nail. Pain on palpation noted to the border of the nail fold. The remaining nails appear unremarkable at this time. There are no open sores, lesions.  Vascular: DP and PT pulses palpable.  No clinical evidence of vascular compromise  Neruologic: Grossly intact via light touch bilateral.  Musculoskeletal: No pedal deformity noted  Assesement: #1 Paronychia with ingrowing nail lateral border left great toe  Plan of Care:  1. Patient evaluated.  2. Discussed treatment alternatives and plan of care. Explained nail avulsion procedure and post procedure course to patient. 3. Patient opted for permanent partial nail avulsion of the ingrown portion of the nail.  4. Prior to procedure, local anesthesia infiltration utilized using 3 ml of a 50:50 mixture of 2% plain lidocaine and 0.5% plain marcaine in a normal hallux block fashion and a betadine prep performed.  5. Partial permanent nail avulsion with chemical matrixectomy performed using 3x30sec applications of phenol followed by alcohol flush.  6. Light dressing applied.  Post care instructions provided 7.  Return to clinic 3 weeks  Felecia Shelling, DPM [facility]  Dr. Felecia Shelling, DPM    [address] [place], [place] [address]                Office [phone]  Fax [phone]

## 2023-05-16 ENCOUNTER — Ambulatory Visit: Payer: BC Managed Care – PPO | Admitting: Podiatry

## 2023-05-23 ENCOUNTER — Encounter: Payer: Self-pay | Admitting: Podiatry

## 2023-05-23 ENCOUNTER — Ambulatory Visit: Payer: BC Managed Care – PPO | Admitting: Podiatry

## 2023-05-23 VITALS — BP 121/76 | HR 64

## 2023-05-23 DIAGNOSIS — L600 Ingrowing nail: Secondary | ICD-10-CM

## 2023-05-23 MED ORDER — DOXYCYCLINE HYCLATE 100 MG PO TABS: 100.0000 mg | ORAL_TABLET | Freq: Two times a day (BID) | ORAL | 0 refills | Status: DC

## 2023-05-23 NOTE — Progress Notes (Signed)
   Chief Complaint  Patient presents with   Nail Problem    "It's fine, still a little sore."    Subjective: 67 y.o. female presents today status post permanent nail avulsion procedure of the lateral border of the left great toe that was performed on 04/25/2023.  Patient soaked her foot and applied antibiotic ointment as instructed.  She continues to have some slight soreness to the area but overall she is doing well.   Past Medical History:  Diagnosis Date   Anemia    IDA   HSV-2 (herpes simplex virus 2) infection    Leucopenia    STD (sexually transmitted disease)    pos gonorrhea   Thyroid disease     Objective: Neurovascular status intact.  Skin is warm, dry and supple.  There is some slight erythema and 'puffiness' around the base of the nail plate of the nail matricectomy site.  Assessment: #1 s/p partial permanent nail matrixectomy lateral border left great toe #2 paronychia lateral border left great toe   Plan of care: -Patient was evaluated  -Light debridement of the periungual debris was performed to the border of the respective toe and nail plate using a tissue nipper. -Continue soaking in triple antibiotic ointment to the area daily -Prescription for doxycycline 100 mg 2 times daily #20 -Return to clinic in 2 weeks, if there is no improvement we will need to reanesthetized the toe and perform an I&D to this area to see if there is any residual nail or tissue within the nail matricectomy site causing erythema and edema   Felecia Shelling, DPM [facility]  Dr. Felecia Shelling, DPM    [address] [place], [place] [address]                Office [phone]  Fax [phone]

## 2023-06-09 ENCOUNTER — Ambulatory Visit: Payer: BC Managed Care – PPO | Admitting: Podiatry

## 2023-08-29 ENCOUNTER — Ambulatory Visit: Payer: Self-pay | Admitting: Physician Assistant

## 2023-09-14 ENCOUNTER — Ambulatory Visit (INDEPENDENT_AMBULATORY_CARE_PROVIDER_SITE_OTHER): Payer: Self-pay | Admitting: Adult Health

## 2023-09-14 ENCOUNTER — Other Ambulatory Visit: Payer: Self-pay

## 2023-09-14 ENCOUNTER — Encounter: Payer: Self-pay | Admitting: Adult Health

## 2023-09-14 VITALS — BP 110/80 | HR 90 | Temp 97.4°F | Ht 67.5 in

## 2023-09-14 DIAGNOSIS — J22 Unspecified acute lower respiratory infection: Secondary | ICD-10-CM

## 2023-09-14 MED ORDER — ALBUTEROL SULFATE HFA 108 (90 BASE) MCG/ACT IN AERS: INHALATION_SPRAY | RESPIRATORY_TRACT | 0 refills | Status: DC

## 2023-09-14 MED ORDER — GUAIFENESIN-CODEINE 100-10 MG/5ML PO SOLN: 5.0000 mL | Freq: Every evening | ORAL | 0 refills | Status: DC | PRN

## 2023-09-14 MED ORDER — PREDNISONE 10 MG PO TABS: ORAL_TABLET | ORAL | 0 refills | Status: AC

## 2023-09-14 MED ORDER — AZITHROMYCIN 250 MG PO TABS: ORAL_TABLET | ORAL | 0 refills | Status: AC

## 2023-09-14 NOTE — Progress Notes (Signed)
[facility] [address] [place], [place] [address]   Office Visit Note  Patient Name: Carol Davis Date of Birth [id number]  Medical Record number [id number]  Date of Service: 09/14/2023  Chief Complaint  Patient presents with   Cough    Patient c/o productive cough with green sputum, nasal drainage, and fatigue. Denies SOB but states she had a fever (101) and chills during the initial onset of symptoms this past Sunday. She states she is having difficulty sleeping. She took a COVID test at home which was negative. She has tessalon perles on hand which only slightly help with cough.      HPI Pt is here for a sick visit. She reports 5 days ago she had fever,  It improved the next day.  She does have cough, sneezing, headache.  The cough is waking her up multiple times a night.    Current Medication:  Outpatient Encounter Medications as of 09/14/2023  Medication Sig   albuterol (VENTOLIN HFA) 108 (90 Base) MCG/ACT inhaler Inhale 2 puffs three times daily for one week.  Then use 2 puffs every 4-6 hours as needed for cough   azithromycin (ZITHROMAX) 250 MG tablet Take 2 tablets on day 1, then 1 tablet daily on days 2 through 5   celecoxib (CELEBREX) 200 MG capsule Take 200 mg by mouth daily.   diazepam (VALIUM) 5 MG tablet Take 5 mg by mouth daily as needed.   fexofenadine (ALLEGRA) 60 MG tablet Take 60 mg by mouth 2 (two) times daily as needed.   guaiFENesin-codeine 100-10 MG/5ML syrup Take 5 mLs by mouth at bedtime as needed for cough.   levothyroxine (SYNTHROID) 112 MCG tablet Take 112 mcg by mouth daily before breakfast.   loratadine (CLARITIN) 10 MG tablet Take 10 mg by mouth daily as needed.   predniSONE (DELTASONE) 10 MG tablet Take 6 tablets (60 mg total) by mouth daily with breakfast for 1 day, THEN 5 tablets (50 mg total) daily with breakfast for 1 day, THEN 4 tablets (40 mg total) daily with breakfast for 1 day, THEN 3 tablets (30 mg total) daily with breakfast  for 1 day, THEN 2 tablets (20 mg total) daily with breakfast for 1 day, THEN 1 tablet (10 mg total) daily with breakfast for 1 day.   No facility-administered encounter medications on file as of 09/14/2023.      Medical History: Past Medical History:  Diagnosis Date   Anemia    IDA   HSV-2 (herpes simplex virus 2) infection    Leucopenia    STD (sexually transmitted disease)    pos gonorrhea   Thyroid disease      Vital Signs: BP 110/80   Pulse 90   Temp (!) 97.4 F (36.3 C)   Ht 5' 7.5" (1.715 m)   SpO2 95%   BMI 36.66 kg/m    Review of Systems  Constitutional:  Negative for chills, fatigue and fever.  HENT:  Positive for sneezing. Negative for congestion, sinus pressure and sinus pain.   Eyes:  Negative for pain and itching.  Respiratory:  Positive for cough.   Cardiovascular:  Negative for chest pain.  Gastrointestinal:  Negative for diarrhea, nausea and vomiting.  Neurological:  Positive for headaches.    Physical Exam Vitals reviewed.  Constitutional:      Appearance: Normal appearance.  HENT:     Head: Normocephalic.     Right Ear: Tympanic membrane and ear canal normal.  Left Ear: Tympanic membrane and ear canal normal.     Nose: Nose normal.     Mouth/Throat:     Mouth: Mucous membranes are moist.  Eyes:     Pupils: Pupils are equal, round, and reactive to light.  Pulmonary:     Effort: Pulmonary effort is normal.     Breath sounds: Rhonchi present.  Lymphadenopathy:     Cervical: No cervical adenopathy.  Neurological:     Mental Status: She is alert.    Assessment/Plan: 1. Lower resp. tract infection Use Albuterol, prednisone and zpak as directed. Follow up via MyChart messenger if symptoms fail to improve or may return to clinic as needed for worsening symptoms.   - albuterol (VENTOLIN HFA) 108 (90 Base) MCG/ACT inhaler; Inhale 2 puffs three times daily for one week.  Then use 2 puffs every 4-6 hours as needed for cough  Dispense: 8 g;  Refill: 0 - predniSONE (DELTASONE) 10 MG tablet; Take 6 tablets (60 mg total) by mouth daily with breakfast for 1 day, THEN 5 tablets (50 mg total) daily with breakfast for 1 day, THEN 4 tablets (40 mg total) daily with breakfast for 1 day, THEN 3 tablets (30 mg total) daily with breakfast for 1 day, THEN 2 tablets (20 mg total) daily with breakfast for 1 day, THEN 1 tablet (10 mg total) daily with breakfast for 1 day.  Dispense: 21 tablet; Refill: 0 - azithromycin (ZITHROMAX) 250 MG tablet; Take 2 tablets on day 1, then 1 tablet daily on days 2 through 5  Dispense: 6 tablet; Refill: 0     General Counseling: Ayumi verbalizes understanding of the findings of todays visit and agrees with plan of treatment. I have discussed any further diagnostic evaluation that may be needed or ordered today. We also reviewed her medications today. she has been encouraged to call the office with any questions or concerns that should arise related to todays visit.   No orders of the defined types were placed in this encounter.   Meds ordered this encounter  Medications   albuterol (VENTOLIN HFA) 108 (90 Base) MCG/ACT inhaler    Sig: Inhale 2 puffs three times daily for one week.  Then use 2 puffs every 4-6 hours as needed for cough    Dispense:  8 g    Refill:  0   predniSONE (DELTASONE) 10 MG tablet    Sig: Take 6 tablets (60 mg total) by mouth daily with breakfast for 1 day, THEN 5 tablets (50 mg total) daily with breakfast for 1 day, THEN 4 tablets (40 mg total) daily with breakfast for 1 day, THEN 3 tablets (30 mg total) daily with breakfast for 1 day, THEN 2 tablets (20 mg total) daily with breakfast for 1 day, THEN 1 tablet (10 mg total) daily with breakfast for 1 day.    Dispense:  21 tablet    Refill:  0   azithromycin (ZITHROMAX) 250 MG tablet    Sig: Take 2 tablets on day 1, then 1 tablet daily on days 2 through 5    Dispense:  6 tablet    Refill:  0   guaiFENesin-codeine 100-10 MG/5ML syrup    Sig:  Take 5 mLs by mouth at bedtime as needed for cough.    Dispense:  80 mL    Refill:  0    Time spent:20 Minutes    Johnna Acosta AGNP-C Nurse Practitioner

## 2023-09-15 ENCOUNTER — Ambulatory Visit (INDEPENDENT_AMBULATORY_CARE_PROVIDER_SITE_OTHER): Payer: Self-pay | Admitting: Adult Health

## 2023-09-15 DIAGNOSIS — J22 Unspecified acute lower respiratory infection: Secondary | ICD-10-CM

## 2023-09-15 NOTE — Progress Notes (Signed)
Virtual Visit via Video Note  I connected with Carol Davis on 09/15/23 at  8:30 AM EST by a video enabled telemedicine application and verified that I am speaking with the correct person using two identifiers.  Location: Patient: Home Provider: Office   I discussed the limitations of evaluation and management by telemedicine and the availability of in person appointments. The patient expressed understanding and agreed to proceed.  History of Present Illness: Pt seen yesterday, see OV note.  IS concerned because she was not able to sleep last night, and had one episode of vomiting after coughing fit.    Observations/Objective: Speaking in full sentences. Does not appear short of breath on the phone.  Some coughing noted.  Assessment and Plan: Continue medication regimen as prescribed.   Follow Up Instructions: Follow up via mychart or in clinic as needed.   I discussed the assessment and treatment plan with the patient. The patient was provided an opportunity to ask questions and all were answered. The patient agreed with the plan and demonstrated an understanding of the instructions.   The patient was advised to call back or seek an in-person evaluation if the symptoms worsen or if the condition fails to improve as anticipated.  I provided 10 minutes of non-face-to-face time during this encounter.   Johnna Acosta, NP

## 2023-09-18 ENCOUNTER — Ambulatory Visit (INDEPENDENT_AMBULATORY_CARE_PROVIDER_SITE_OTHER): Payer: Self-pay | Admitting: Physician Assistant

## 2023-09-18 DIAGNOSIS — J22 Unspecified acute lower respiratory infection: Secondary | ICD-10-CM

## 2023-09-18 DIAGNOSIS — J9801 Acute bronchospasm: Secondary | ICD-10-CM

## 2023-09-18 NOTE — Progress Notes (Signed)
Virtual Visit Consent   Carol Davis, you are scheduled for a virtual visit with a [facility] provider today. Just as with appointments in the office, your consent must be obtained to participate.   I need to obtain your verbal consent now. Are you willing to proceed with your visit today? Carol Davis has provided verbal consent on 09/18/2023 for a virtual visit (video or telephone). Gilberto Better, [place]  Date: 09/18/2023 3:20 PM  Virtual Visit via Video Note   I, Carol Davis, connected with  Carol Davis  ([id number], 05-04-1956) on 09/18/23 at  3:00 PM EST by telephone and verified that I am speaking with the correct person using two identifiers.  Location: Patient: Virtual Visit Location Patient: Home Provider: Virtual Visit Location Provider: Office/Clinic   I discussed the limitations of evaluation and management by telemedicine and the availability of in person appointments. The patient expressed understanding and agreed to proceed.    History of Present Illness: Carol Davis is a 67 y.o. and is being seen today for cough.  HPI: 67 y/o F continued to have coughing fits. She is on her last day of oral steroid. She finished Zpack. Uses inhaler. Uses cough syrup at bedtime as previously prescribed.   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  Then use 2 puffs every 4-6 hours as needed for cough, Disp: 8 g, Rfl: 0   azithromycin (ZITHROMAX) 250 MG tablet, Take 2 tablets on day 1, then 1 tablet daily on days 2 through 5, Disp: 6 tablet, Rfl: 0   celecoxib (CELEBREX) 200 MG capsule, Take 200 mg by mouth daily., Disp: , Rfl:    diazepam (VALIUM) 5 MG tablet, Take 5 mg by mouth daily as needed., Disp: , Rfl:    fexofenadine (ALLEGRA) 60 MG tablet, Take 60 mg by mouth 2 (two) times daily as needed., Disp: , Rfl:    guaiFENesin-codeine 100-10 MG/5ML syrup, Take 5 mLs by mouth at bedtime as needed for cough., Disp: 80 mL, Rfl: 0   levothyroxine (SYNTHROID) 112 MCG tablet, Take 112 mcg by mouth daily before breakfast., Disp: , Rfl:    loratadine (CLARITIN) 10 MG tablet, Take 10 mg by mouth daily as needed., Disp: , Rfl:    predniSONE (DELTASONE) 10 MG tablet, Take 6 tablets (60 mg total) by mouth daily with breakfast for 1 day, THEN 5 tablets (50 mg total) daily with breakfast for 1 day, THEN 4 tablets (40 mg total) daily with breakfast for 1 day, THEN 3 tablets (30 mg total) daily with breakfast for 1 day, THEN 2 tablets (20 mg total) daily with breakfast for 1 day, THEN 1 tablet (10 mg total) daily with breakfast for 1 day., Disp: 21 tablet, Rfl: 0  Observations/Objective: Speech is clear and coherent with logical content.  Patient is alert and oriented at baseline.    Assessment and Plan: 1. Lower resp. tract infection  2. Bronchospasm  Start oral decongestant as directed on the box. May continue to use inhaler Use cough syrup at bedtime. Increase fluids May take Tessalon during the day if needed as previously prescribed. Pt will contact us if symptoms don't improve.  Pt verbalized understanding and in agreement.    Follow Up Instructions: I discussed the assessment and treatment plan with the patient. The patient was provided  an opportunity to ask questions and all were answered. The patient agreed with the plan and demonstrated an understanding of the instructions.    The patient was advised to call back or seek an in-person evaluation if the symptoms worsen or if the condition fails to improve as anticipated.  Time:  I spent 10 minutes with the patient via telehealth technology discussing the above problems/concerns.    Gilberto Better, PA-C

## 2024-04-12 ENCOUNTER — Other Ambulatory Visit: Payer: Self-pay

## 2024-04-12 DIAGNOSIS — B009 Herpesviral infection, unspecified: Secondary | ICD-10-CM

## 2024-04-12 MED ORDER — VALACYCLOVIR HCL 500 MG PO TABS: 500.0000 mg | ORAL_TABLET | Freq: Two times a day (BID) | ORAL | 1 refills | Status: DC

## 2024-04-12 MED ORDER — VALACYCLOVIR HCL 500 MG PO TABS: 500.0000 mg | ORAL_TABLET | Freq: Two times a day (BID) | ORAL | 1 refills | Status: AC

## 2024-04-12 NOTE — Progress Notes (Signed)
 Received call from patient stating she is having an genital herpes outbreak.  This is not her first outbreak; she has been prescribed Valacyclovir  in the past. She has previously been a Dr. Denman Fischer patient and is scheduled to see Dr. Dell Fennel 04/30/24. Valacyclovir  500mg  po bid x 3 days sent to patient's pharmacy.  Liane Redman RN

## 2024-04-15 NOTE — Progress Notes (Unsigned)
    GYNECOLOGY PROGRESS NOTE  Subjective:    Patient ID: CHANAY NUGENT, female    DOB: May 17, 1956, 68 y.o.   MRN: [id number]  HPI  Patient is a 68 y.o. single [id number] here for a work in visit due to 3 days of vulvar/mons itching. She has a h/o HSV outbreak 40 years ago and feels that this is the cause. She has tried OTC cortisone cream and 3 days of oral acyclovir (although I don't see it on her med list). She still has the itching. She reports that she has been abstinent for about 5 years.  The following portions of the patient's history were reviewed and updated as appropriate: allergies, current medications, past family history, past medical history, past social history, past surgical history, and problem list.  Review of Systems Pertinent items are noted in HPI.  Pap normal 2021  Objective:   There were no vitals taken for this visit. There is no height or weight on file to calculate BMI. Well nourished, well hydrated White female, no apparent distress She is ambulating and conversing normally. EG- small (0.5 mm) lesion on right side of mons pubis c/w HSV. 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

## 2024-04-15 NOTE — Patient Instructions (Signed)
 Genital Herpes Genital herpes is a common sexually transmitted infection (STI) that is caused by a virus. The virus spreads from person to person through contact with a sore, infected saliva, or infected skin. The virus can cause itching, blisters, and sores around the genitals or rectum. During an outbreak of infection, symptoms may last for several days and then go away. However, the virus remains in the body, so more outbreaks may happen in the future. The time between outbreaks varies and can be from months to years. Genital herpes can affect anyone. It is particularly concerning for pregnant women because the virus can be passed to the baby during delivery. Genital herpes is also a concern for people who have a weak disease-fighting system (immune system). What are the causes? This condition is caused by the herpes simplex virus, type 1 or type 2 (HSV-1 or HSV-2). The virus may spread through: Sexual contact with an infected person, including vaginal, anal, and oral sex. Contact with a herpes sore. The skin. This means that you can get herpes from an infected partner even if there are no blisters or sores present. Your partner may not know that he or she is infected. What increases the risk? You are more likely to develop this condition if: You have sex with many partners. You do not use latex or polyurethane condoms during sex. What are the signs or symptoms? Most people do not have symptoms or they have mild symptoms that may be mistaken for other skin problems. Symptoms may include: Small, red bumps near the genitals, rectum, or mouth. These bumps turn into blisters and then sores. Flu-like (influenza-like) symptoms, including: Fever. Body aches. Swollen lymph nodes. Headache. Painful urination. Pain and itching in the genital area or rectal area. Vaginal discharge. Tingling or shooting pain in the legs and buttocks. Generally, symptoms are more severe and last longer during the first  (primary) outbreak. Influenza-like symptoms are also more common during the primary outbreak. How is this diagnosed? This condition may be diagnosed based on: A physical exam. Your medical history. Blood tests. A test of a fluid sample (culture) from an open sore. How is this treated? There is no cure for this condition, but treatment with antiviral medicines can do the following: Speed up healing and relieve symptoms. Help to reduce the spread of the virus to sexual partners. Limit the chance of future outbreaks, or make future outbreaks shorter. Lessen symptoms of future outbreaks. Your health care provider may also recommend over-the-counter medicines to help with pain and itching. Follow these instructions at home: If you have an outbreak:  Keep the affected areas dry and clean. Avoid rubbing or touching blisters and sores. If you do touch blisters or sores: Wash your hands thoroughly with soap and water for at least 20 seconds. If soap and water are not available, use an alcohol-based hand sanitizer. Do not touch your eyes afterward. Sexual activity Do not have sexual contact during active outbreaks. Practice safe sex. Herpes can spread even if your partner does not have blisters or sores. Latex or polyurethane condoms and female condoms may help prevent the spread of the herpes virus. Managing pain and discomfort If directed, put ice on the painful area. To do this: Put ice in a plastic bag. Place a towel between your skin and the bag. Leave the ice on for 20 minutes, 2-3 times a day. Remove the ice if your skin turns bright red. This is very important. If you cannot feel pain, heat, or  cold, you have a greater risk of damage to the area. If told, take a cool sitz bath to help relieve pain or itching. A sitz bath is a water bath that you take while sitting down in water that is deep enough to cover your hips and buttocks. General instructions Take over-the-counter and  prescription medicines only as told by your health care provider. If you were prescribed an antiviral medicine, use it as told by your health care provider. Do not stop using the antiviral even if you start to feel better. Keep all follow-up visits. This is important. How is this prevented? Use condoms. Although you can get genital herpes during sexual contact even with the use of a condom, a condom can provide some protection. Avoid having multiple sexual partners. Talk with your sexual partner about any symptoms either of you may have. Also, talk with your partner about any history of STIs. Do not have sexual contact if you have active symptoms of genital herpes. Contact a health care provider if: Your symptoms are not improving with medicine. Your symptoms return, or you have new symptoms. You have a fever. You have abdominal pain. You have redness, swelling, or pain in your eye. You notice new sores on other parts of your body. You have had herpes and you become pregnant or plan to become pregnant. Get help right away if: You have symptoms of viral meningitis. This is rare but may happen if the virus spreads to the brain. Symptoms may include: Severe headache or stiff neck. Muscle aches. Nausea and vomiting. Sensitivity to light. Summary Genital herpes is a common sexually transmitted infection (STI) that is caused by the herpes simplex virus, type 1 or type 2 (HSV-1 or HSV-2). These viruses are most often spread through sexual contact with an infected person. You are more likely to develop this condition if you have sex with many partners or you do not use condoms during sex. Most people do not have symptoms or have mild symptoms that may be mistaken for other skin problems. Symptoms occur as outbreaks that may happen months or years apart. There is no cure for this condition, but treatment with oral antiviral medicines can reduce symptoms, reduce the chance of spreading the virus to  a partner, prevent future outbreaks, or shorten future outbreaks. This information is not intended to replace advice given to you by your health care provider. Make sure you discuss any questions you have with your health care provider. Document Revised: 07/22/2021 Document Reviewed: 07/22/2021 Elsevier Patient Education  2024 [employer].

## 2024-04-16 ENCOUNTER — Ambulatory Visit: Admitting: Obstetrics & Gynecology

## 2024-04-16 VITALS — BP 125/96 | HR 79 | Ht 67.0 in | Wt 221.9 lb

## 2024-04-16 DIAGNOSIS — B009 Herpesviral infection, unspecified: Secondary | ICD-10-CM | POA: Diagnosis not present

## 2024-04-16 DIAGNOSIS — Z1231 Encounter for screening mammogram for malignant neoplasm of breast: Secondary | ICD-10-CM

## 2024-04-16 MED ORDER — VALACYCLOVIR HCL 1 G PO TABS: 1000.0000 mg | ORAL_TABLET | Freq: Every day | ORAL | 12 refills | Status: AC

## 2024-04-30 ENCOUNTER — Ambulatory Visit
Admission: RE | Admit: 2024-04-30 | Discharge: 2024-04-30 | Disposition: A | Discharge status: Home / Self Care | Source: Ambulatory Visit | Attending: Obstetrics & Gynecology | Admitting: Obstetrics & Gynecology

## 2024-04-30 ENCOUNTER — Ambulatory Visit: Admitting: Obstetrics

## 2024-04-30 DIAGNOSIS — Z1231 Encounter for screening mammogram for malignant neoplasm of breast: Secondary | ICD-10-CM | POA: Diagnosis present

## 2024-05-02 ENCOUNTER — Other Ambulatory Visit: Payer: Self-pay | Admitting: Obstetrics & Gynecology

## 2024-05-02 DIAGNOSIS — R928 Other abnormal and inconclusive findings on diagnostic imaging of breast: Secondary | ICD-10-CM

## 2024-05-06 ENCOUNTER — Other Ambulatory Visit: Payer: Self-pay | Admitting: Obstetrics

## 2024-05-06 DIAGNOSIS — R928 Other abnormal and inconclusive findings on diagnostic imaging of breast: Secondary | ICD-10-CM

## 2024-05-09 ENCOUNTER — Ambulatory Visit
Admission: RE | Admit: 2024-05-09 | Discharge: 2024-05-09 | Disposition: A | Discharge status: Home / Self Care | Source: Ambulatory Visit | Attending: Obstetrics | Admitting: Obstetrics

## 2024-05-09 DIAGNOSIS — R928 Other abnormal and inconclusive findings on diagnostic imaging of breast: Secondary | ICD-10-CM

## 2024-05-27 ENCOUNTER — Encounter: Payer: Self-pay | Admitting: Obstetrics & Gynecology

## 2024-07-25 DIAGNOSIS — Z96652 Presence of left artificial knee joint: Secondary | ICD-10-CM | POA: Insufficient documentation

## 2024-07-25 NOTE — Discharge Summary (Signed)
 Orthopedic Surgery Discharge Summary  Patient ID: Carol Davis [id number] 68 y.o. 05/04/1956  Admit date: 07/25/2024 Admitting Physician: Bryan Vinie Haus, MD Admission Diagnoses:  Principal Problem (Resolved):   Primary osteoarthritis of left knee Active Problems:   Subclinical hypothyroidism   UC (ulcerative colitis) (HCC)   Leukopenia   Impaired functional mobility, balance, gait, and endurance   Impaired mobility and ADLs   S/P TKR (total knee replacement), left (Dr.Langfitt, 07/25/24)    Discharge date and time: 07/26/2024   Discharge Physician: Bryan Vinie Langfitt* Discharge Diagnoses:  Principal Problem (Resolved):   Primary osteoarthritis of left knee Active Problems:   Subclinical hypothyroidism   UC (ulcerative colitis) (HCC)   Leukopenia   Impaired functional mobility, balance, gait, and endurance   Impaired mobility and ADLs   S/P TKR (total knee replacement), left (Dr.Langfitt, 07/25/24)     Surgical operations performed during hospitalization: Procedure(s) (LRB): ARTHROPLASTY TOTAL KNEE REPLACEMENT ROBOTIC (Left)   Indications for Operative Procedures: Primary osteoarthritis of left knee [facility]    Hospital Course:   The patient was taken to the operating room on 07/25/2024 and underwent the above stated surgical procedure without complications.  For details of the operative procedure, please refer to the operative note.  The patient was then transferred to the post-op Orthopaedic floor for recovery, physical therapy and discharge planning. The patient was given Aspirin 81 mg BID x 6 weeks for the prevention of DVTs, along with early ambulation and sequential compression devices.    The patient had adequate pain control with po pain medications and IV pain medications for breakthrough pain. At the time of discharge, the patient's pain was well controlled with po pain medications.  Physical and Occupational therapy were initiated and worked  with the patient towards discharge goals with clearance from PT for discharge home by the time of discharge.   The patient will be WBAT to the left side. The patient has a Sylke with an abd pad secured with tape dressing in place to the surgical incision. This will be removed at first postop visit on 08/06/24. Should not stay on beyond 08/08/24. At the time of discharge, the patient is afebrile, vital signs are stable and the patient is in no acute distress. Compartments are soft. Peripheral pulses are 2+ bilaterally. The patient is neurovascularly intact with an exam as per the last progress note. The patient is medically stable and safe for discharge home to continue their rehabilitation.   Consults:  None  Disposition: Home  Patient Instructions:     Medication List     PAUSE taking these medications    acetaminophen 500 mg tablet Wait to take this until your doctor or other care provider tells you to start again. Do not take while take oxycodone-acetaminophen (Percocet). Commonly known as: TYLENOL Take 1,000 mg by mouth every 6 (six) hours.       START taking these medications    aspirin 81 mg EC tablet Take 1 tablet (81 mg total) by mouth 2 (two) times a day.   methocarbamoL 500 mg tablet Commonly known as: ROBAXIN Take 1 tablet (500 mg total) by mouth 4 (four) times a day for 10 days.   ondansetron 4 mg disintegrating tablet Commonly known as: ZOFRAN-ODT Dissolve 1 tablet (4 mg total) on tongue every 8 (eight) hours as needed for nausea or vomiting.   oxyCODONE 5 mg immediate release tablet Commonly known as: ROXICODONE Take ONE-HALF TO ONE tablet (2.5-5 mg total) by mouth every 4 (four)  hours as needed for severe pain or moderate pain (Take 0.5-1 tablets (2.5-5 mg total) by mouth every 4 (four) hours as needed for severe pain (7-10) or moderate pain (4-6).)   sennosides-docusate sodium  8.6-50 mg per tablet Commonly known as: PERICOLACE Take 2 tablets by mouth 2  (two) times a day.       CHANGE how you take these medications    celecoxib 200 mg capsule Commonly known as: CeleBREX Take 1 capsule (200 mg total) by mouth daily. What changed: when to take this       CONTINUE taking these medications    cholecalciferol 1,000 unit (25 mcg) tablet Commonly known as: VITAMIN D3 Take 1,000 Units by mouth daily.   levothyroxine 88 mcg tablet Commonly known as: SYNTHROID Take 88 mcg by mouth every morning.   loratadine 10 mg tablet Commonly known as: CLARITIN Take 10 mg by mouth daily as needed.   valACYclovir  1 gram tablet Commonly known as: VALTREX  Take 1,000 mg by mouth as needed.       STOP taking these medications    ibuprofen 200 mg tablet Commonly known as: MOTRIN         Where to Get Your Medications     These medications were sent to [facility]  [address] [place], [place] [address]    Hours: Mon-Fri: 8:30am - 5pm; Sat-Sun: Closed; Holidays: Closed Phone: [phone]  aspirin 81 mg EC tablet celecoxib 200 mg capsule methocarbamoL 500 mg tablet ondansetron 4 mg disintegrating tablet oxyCODONE 5 mg immediate release tablet sennosides-docusate sodium  8.6-50 mg per tablet       The [place]  Controlled Substance Reporting System was reviewed for this patient prior to providing discharge narcotic prescription.   Activity: activity as tolerated and no driving while on analgesics Diet: regular diet Wound Care: keep wound clean and dry, reinforce dressing PRN, ice to area for comfort, and as directed Weight Bearing Status: WBAT DVT PPX: EC ASA 81 mg twice daily x 6 weeks    Please contact your doctor or seek medical assistance if you experience any of the following:  1. Fever greater than 101.5 F. 2. Decrease in urinary output. 3. Increased warmth, swelling, redness, or pain at your incision/wound site. 4. Increased drainage or bad odor at your incision/wound site.  5.  Nausea/vomiting that does not stop.  6. Numbness, tingling, or discoloration of extremity.  7. Unable to drink fluids.  8. Uncontrollable pain.  9. Any other concerning symptoms.   Please, call 911 or go to your nearest emergency room if you experience any of the following:  1. Change in speech, vision, or ability to walk 2. Chest pain.  3. Difficulty breathing or shortness of breath.  Follow-up:  Future Appointments  Date Time Provider Department Center  08/06/2024  9:30 AM [facility] JOINT NURSE [facility] [place]  09/06/2024 11:20 AM Bryan Vinie Haus, MD [facility] [place]  11/27/2024 10:20 AM Fairy Lamar Lee, PA-C [facility] [place] Laymon Orlando Abe, FNP

## 2024-08-19 ENCOUNTER — Ambulatory Visit (INDEPENDENT_AMBULATORY_CARE_PROVIDER_SITE_OTHER): Payer: Self-pay | Admitting: Family

## 2024-08-19 VITALS — BP 106/68 | HR 73 | Ht 67.0 in | Wt 226.4 lb

## 2024-08-19 DIAGNOSIS — D509 Iron deficiency anemia, unspecified: Secondary | ICD-10-CM | POA: Diagnosis not present

## 2024-08-19 DIAGNOSIS — E559 Vitamin D deficiency, unspecified: Secondary | ICD-10-CM

## 2024-08-19 DIAGNOSIS — E538 Deficiency of other specified B group vitamins: Secondary | ICD-10-CM

## 2024-08-19 DIAGNOSIS — E039 Hypothyroidism, unspecified: Secondary | ICD-10-CM

## 2024-08-19 DIAGNOSIS — R5383 Other fatigue: Secondary | ICD-10-CM | POA: Diagnosis not present

## 2024-08-19 DIAGNOSIS — Z0130 Encounter for examination of blood pressure without abnormal findings: Secondary | ICD-10-CM

## 2024-08-19 DIAGNOSIS — F1021 Alcohol dependence, in remission: Secondary | ICD-10-CM

## 2024-08-19 DIAGNOSIS — K5190 Ulcerative colitis, unspecified, without complications: Secondary | ICD-10-CM

## 2024-09-01 ENCOUNTER — Encounter: Payer: Self-pay | Admitting: Family

## 2024-09-01 NOTE — Progress Notes (Signed)
 New Patient Office Visit  Subjective  Patient ID: Carol Davis, female    DOB: 1956/04/01  Age: 68 y.o. MRN: [id number]  CC:  Chief Complaint  Patient presents with   Establish Care    NPE    HPI Carol Davis presents to establish care Previous Primary Care provider/office:   she does have additional concerns to discuss today.   Patient presents to establish primary care, having never had a PCP previously. Recently underwent left knee replacement surgery 3 wk ago at [facility] with excellent recovery.  Seeks clarification on primary care services, preventive care coordination, and routine health maintenance.  Currently manages multiple chronic conditions through specialists including endocrinology for thyroid  disorders, gastroenterology for colitis flare-ups, and gynecology for routine care.  Inquires about acute illness management protocols and medication prescribing practices, specifically regarding codeine -containing medications for cough suppression related to underlying asthma. Expresses interest in establishing annual physical exam schedule and understanding insurance coverage for preventive services.  Reports typical pattern of getting sick approximately once yearly, with cough being primary symptom requiring codeine  for sleep. Has personal and family history demonstrating effectiveness of codeine  for respiratory symptoms. Recent laboratory work completed in September showed low B12 level at 218 (normal range [phone], preferred >400) and chronically low WBC count, which previously prompted hematology-oncology evaluation approximately 12 years ago due to concern for malignancy. Current WBC from recent surgical labs was 5.95, which is WNL. Reports consuming significant amounts of animal protein including beef, chicken, pork, cheese, and cottage cheese daily. Denies vegetarian or vegan diet. Has been advised to start vitamin D  supplementation by endocrinologist but  deferred initiation per surgeon's recommendation during post-operative period.    Outpatient Encounter Medications as of 08/19/2024  Medication Sig   celecoxib (CELEBREX) 200 MG capsule Take 200 mg by mouth daily.   guaiFENesin -codeine  100-10 MG/5ML syrup Take 5 mLs by mouth at bedtime as needed for cough.   levothyroxine (SYNTHROID) 112 MCG tablet Take 112 mcg by mouth daily before breakfast.   loratadine (CLARITIN) 10 MG tablet Take 10 mg by mouth daily as needed.   valACYclovir  (VALTREX ) 1000 MG tablet Take 1 tablet (1,000 mg total) by mouth daily. (Patient taking differently: Take 1,000 mg by mouth as needed.)   [DISCONTINUED] albuterol  (VENTOLIN  HFA) 108 (90 Base) MCG/ACT inhaler Inhale 2 puffs three times daily for one week.  Then use 2 puffs every 4-6 hours as needed for cough (Patient not taking: Reported on 08/19/2024)   [DISCONTINUED] diazepam (VALIUM) 5 MG tablet Take 5 mg by mouth daily as needed.   [DISCONTINUED] fexofenadine (ALLEGRA) 60 MG tablet Take 60 mg by mouth 2 (two) times daily as needed.   No facility-administered encounter medications on file as of 08/19/2024.    Past Medical History:  Diagnosis Date   Anemia    IDA   HSV-2 (herpes simplex virus 2) infection    Leucopenia    STD (sexually transmitted disease)    pos gonorrhea   Thyroid  disease     Past Surgical History:  Procedure Laterality Date   ANUS SURGERY  6-10 years ago   AUGMENTATION MAMMAPLASTY  10/2016   removed    BREAST IMPLANT REMOVAL Bilateral    COLONOSCOPY  within last 3 years   TOTAL HIP ARTHROPLASTY Left     Family History  Problem Relation Age of Onset   Diabetes Mother    Diabetes Father    Diabetes Brother    Breast cancer Neg Hx  Ovarian cancer Neg Hx    Colon cancer Neg Hx    Heart disease Neg Hx     Social History   Socioeconomic History   Marital status: Single    Spouse name: Not on file   Number of children: Not on file   Years of education: Not on file    Highest education level: Not on file  Occupational History   Not on file  Tobacco Use   Smoking status: Former    Current packs/day: 0.00    Types: Cigarettes    Quit date: 02/29/1988    Years since quitting: 36.5   Smokeless tobacco: Never   Tobacco comments:    quit 30 years ago  Vaping Use   Vaping status: Never Used  Substance and Sexual Activity   Alcohol use: Not Currently   Drug use: Not Currently   Sexual activity: Not Currently    Partners: Male    Birth control/protection: Post-menopausal  Other Topics Concern   Not on file  Social History Narrative   Not on file   Social Drivers of Health   Financial Resource Strain: Not on file  Food Insecurity: Low Risk  (07/25/2024)   Received from [facility]   Hunger Vital Sign    Within the past 12 months, you worried that your food would run out before you got money to buy more: Never true    Within the past 12 months, the food you bought just didn't last and you didn't have money to get more. : Never true  Transportation Needs: No Transportation Needs (07/25/2024)   Received from [employer]    In the past 12 months, has lack of reliable transportation kept you from medical appointments, meetings, work or from getting things needed for daily living? : No  Physical Activity: Insufficiently Active (06/21/2018)   Exercise Vital Sign    Days of Exercise per Week: 2 days    Minutes of Exercise per Session: 60 min  Stress: Not on file  Social Connections: Not on file  Intimate Partner Violence: Not on file    Review of Systems  All other systems reviewed and are negative.       Objective   BP 106/68   Pulse 73   Ht 5' 7 (1.702 m)   Wt 226 lb 6.4 oz (102.7 kg)   SpO2 97%   BMI 35.46 kg/m   Physical Exam Vitals and nursing note reviewed.  Constitutional:      Appearance: Normal appearance. She is normal weight.  HENT:     Head: Normocephalic.  Eyes:     Extraocular Movements:  Extraocular movements intact.     Conjunctiva/sclera: Conjunctivae normal.     Pupils: Pupils are equal, round, and reactive to light.  Cardiovascular:     Rate and Rhythm: Normal rate.  Pulmonary:     Effort: Pulmonary effort is normal.  Neurological:     General: No focal deficit present.     Mental Status: She is alert and oriented to person, place, and time. Mental status is at baseline.  Psychiatric:        Mood and Affect: Mood normal.        Behavior: Behavior normal.        Thought Content: Thought content normal.       Assessment & Plan B12 deficiency Vitamin D  deficiency, unspecified Iron deficiency anemia, unspecified iron deficiency anemia type Other fatigue Will continue supplements as needed.  Hypothyroidism (acquired) Patient is seen by endocrinology, who manage this condition.  She is well controlled with current therapy.   Will defer to them for further changes to plan of care.  Recovering alcoholic in remission [facility]) Patient stable.  Well controlled with current therapy.   Continue current meds.   Ulcerative colitis without complications, unspecified location [facility]) Patient is seen by gastroenterology, who manage this condition.  She is well controlled with current therapy.   Will defer to them for further changes to plan of care.   Check labs at follow up.   Return in about 3 months (around 11/19/2024) for CPE.    Total time spent: 30 minutes  ALAN CHRISTELLA ARRANT, FNP  08/19/2024  This document may have been prepared by [facility] Voice Recognition software and as such may include unintentional dictation errors.

## 2024-09-01 NOTE — Assessment & Plan Note (Signed)
 Will continue supplements as needed.

## 2024-09-01 NOTE — Assessment & Plan Note (Signed)
 Patient stable.  Well controlled with current therapy.   Continue current meds.

## 2024-09-01 NOTE — Assessment & Plan Note (Signed)
 Patient is seen by gastroenterology, who manage this condition.  She is well controlled with current therapy.   Will defer to them for further changes to plan of care.

## 2024-10-04 ENCOUNTER — Ambulatory Visit (INDEPENDENT_AMBULATORY_CARE_PROVIDER_SITE_OTHER): Admitting: Cardiology

## 2024-10-04 ENCOUNTER — Encounter: Payer: Self-pay | Admitting: Cardiology

## 2024-10-04 VITALS — BP 126/82 | HR 73 | Ht 67.0 in | Wt 230.6 lb

## 2024-10-04 DIAGNOSIS — Z0130 Encounter for examination of blood pressure without abnormal findings: Secondary | ICD-10-CM

## 2024-10-04 DIAGNOSIS — J22 Unspecified acute lower respiratory infection: Secondary | ICD-10-CM | POA: Diagnosis not present

## 2024-10-04 MED ORDER — METHYLPREDNISOLONE 4 MG PO TBPK: ORAL_TABLET | ORAL | 0 refills | Status: DC

## 2024-10-04 MED ORDER — AMOXICILLIN-POT CLAVULANATE 875-125 MG PO TABS: 1.0000 | ORAL_TABLET | Freq: Two times a day (BID) | ORAL | 0 refills | Status: AC

## 2024-10-04 MED ORDER — BENZONATATE 200 MG PO CAPS: 200.0000 mg | ORAL_CAPSULE | Freq: Three times a day (TID) | ORAL | 0 refills | Status: DC | PRN

## 2024-10-04 NOTE — Progress Notes (Signed)
 Established Patient Office Visit  Subjective:  Patient ID: Carol Davis, female    DOB: May 29, 1956  Age: 68 y.o. MRN: [id number]  Chief Complaint  Patient presents with   Acute Visit    Persistent cough started last Sunday, drainage down throat, coughing up green flem.    Patient in office for an acute visit. Patient complaining of a persistent cough, PND, coughing up green phlegm. Reports symptoms started four days ago. Patient also complaining of runny nose, wheezing at night, and nasal congestion. Denies sinus pain, headache, sore throat, fever or chills. No wheezing on exam. Will send in Augmentin , Medrol  dose pack and tessalon  pearls. Drink plenty of water. Recommend Mucinex -D.  Blood pressure well controlled today.  Normal kidney function on recent lab work.  Continue all other medications.   Cough This is a new problem. The current episode started in the past 7 days. The problem has been unchanged. The cough is Productive of purulent sputum. Associated symptoms include nasal congestion, postnasal drip, rhinorrhea and wheezing. Pertinent negatives include no chest pain, chills, ear pain, fever, headaches, myalgias, sore throat or shortness of breath. The symptoms are aggravated by lying down. She has tried nothing for the symptoms. The treatment provided no relief.    No other concerns at this time.   Past Medical History:  Diagnosis Date   Anemia    IDA   HSV-2 (herpes simplex virus 2) infection    Leucopenia    STD (sexually transmitted disease)    pos gonorrhea   Thyroid  disease     Past Surgical History:  Procedure Laterality Date   ANUS SURGERY  6-10 years ago   AUGMENTATION MAMMAPLASTY  10/2016   removed    BREAST IMPLANT REMOVAL Bilateral    COLONOSCOPY  within last 3 years   TOTAL HIP ARTHROPLASTY Left     Social History   Socioeconomic History   Marital status: Single    Spouse name: Not on file   Number of children: Not on file   Years of  education: Not on file   Highest education level: Not on file  Occupational History   Not on file  Tobacco Use   Smoking status: Former    Current packs/day: 0.00    Types: Cigarettes    Quit date: 02/29/1988    Years since quitting: 36.6   Smokeless tobacco: Never   Tobacco comments:    quit 30 years ago  Vaping Use   Vaping status: Never Used  Substance and Sexual Activity   Alcohol use: Not Currently   Drug use: Not Currently   Sexual activity: Not Currently    Partners: Male    Birth control/protection: Post-menopausal  Other Topics Concern   Not on file  Social History Narrative   Not on file   Social Drivers of Health   Financial Resource Strain: Not on file  Food Insecurity: Low Risk  (07/25/2024)   Received from [facility]   Hunger Vital Sign    Within the past 12 months, you worried that your food would run out before you got money to buy more: Never true    Within the past 12 months, the food you bought just didn't last and you didn't have money to get more. : Never true  Transportation Needs: No Transportation Needs (07/25/2024)   Received from [employer]    In the past 12 months, has lack of reliable transportation kept you from medical appointments, meetings, work  or from getting things needed for daily living? : No  Physical Activity: Insufficiently Active (06/21/2018)   Exercise Vital Sign    Days of Exercise per Week: 2 days    Minutes of Exercise per Session: 60 min  Stress: Not on file  Social Connections: Not on file  Intimate Partner Violence: Not on file    Family History  Problem Relation Age of Onset   Diabetes Mother    Diabetes Father    Diabetes Brother    Breast cancer Neg Hx    Ovarian cancer Neg Hx    Colon cancer Neg Hx    Heart disease Neg Hx     Allergies  Allergen Reactions   Other Dermatitis    dermabond adhesive      Outpatient Medications Prior to Visit  Medication Sig   celecoxib (CELEBREX) 200  MG capsule Take 200 mg by mouth daily.   levothyroxine (SYNTHROID) 112 MCG tablet Take 112 mcg by mouth daily before breakfast.   loratadine (CLARITIN) 10 MG tablet Take 10 mg by mouth daily as needed.   valACYclovir  (VALTREX ) 1000 MG tablet Take 1 tablet (1,000 mg total) by mouth daily. (Patient taking differently: Take 1,000 mg by mouth as needed.)   guaiFENesin -codeine  100-10 MG/5ML syrup Take 5 mLs by mouth at bedtime as needed for cough. (Patient not taking: Reported on 10/04/2024)   No facility-administered medications prior to visit.    Review of Systems  Constitutional: Negative.  Negative for chills and fever.  HENT:  Positive for congestion, postnasal drip and rhinorrhea. Negative for ear pain, sinus pain and sore throat.   Eyes: Negative.   Respiratory:  Positive for cough, sputum production and wheezing. Negative for shortness of breath.   Cardiovascular: Negative.  Negative for chest pain.  Gastrointestinal: Negative.  Negative for abdominal pain, constipation and diarrhea.  Genitourinary: Negative.   Musculoskeletal:  Negative for joint pain and myalgias.  Skin: Negative.   Neurological: Negative.  Negative for dizziness and headaches.  Endo/Heme/Allergies: Negative.   All other systems reviewed and are negative.      Objective:   BP 126/82   Pulse 73   Ht 5' 7 (1.702 m)   Wt 230 lb 9.6 oz (104.6 kg)   SpO2 96%   BMI 36.12 kg/m   Vitals:   10/04/24 0952  BP: 126/82  Pulse: 73  Height: 5' 7 (1.702 m)  Weight: 230 lb 9.6 oz (104.6 kg)  SpO2: 96%  BMI (Calculated): 36.11    Physical Exam Vitals and nursing note reviewed.  Constitutional:      Appearance: Normal appearance. She is normal weight.  HENT:     Head: Normocephalic and atraumatic.     Nose: Nose normal.     Mouth/Throat:     Mouth: Mucous membranes are moist.  Eyes:     Extraocular Movements: Extraocular movements intact.     Conjunctiva/sclera: Conjunctivae normal.     Pupils: Pupils  are equal, round, and reactive to light.  Cardiovascular:     Rate and Rhythm: Normal rate and regular rhythm.     Pulses: Normal pulses.     Heart sounds: Normal heart sounds.  Pulmonary:     Effort: Pulmonary effort is normal. No respiratory distress.     Breath sounds: Normal breath sounds. No stridor. No wheezing, rhonchi or rales.  Abdominal:     General: Abdomen is flat. Bowel sounds are normal.     Palpations: Abdomen is soft.  Musculoskeletal:  General: Normal range of motion.     Cervical back: Normal range of motion.  Skin:    General: Skin is warm and dry.  Neurological:     General: No focal deficit present.     Mental Status: She is alert and oriented to person, place, and time.  Psychiatric:        Mood and Affect: Mood normal.        Behavior: Behavior normal.        Thought Content: Thought content normal.        Judgment: Judgment normal.      No results found for any visits on 10/04/24.  No results found for this or any previous visit (from the past 2160 hours).    Assessment & Plan:  Augmentin  Medrol  dose pack Tessalon  pearls Drink plenty of water Mucinex -D   Problem List Items Addressed This Visit       Respiratory   Acute lower respiratory infection - Primary    Return if symptoms worsen or fail to improve, for as scheduled.   Total time spent: 25 minutes. This time includes review of previous notes and results and patient face to face interaction during today's visit.    Jeoffrey Pollen, NP  10/04/2024   This document may have been prepared by [facility] Voice Recognition software and as such may include unintentional dictation errors.

## 2024-10-21 ENCOUNTER — Encounter: Payer: Self-pay | Admitting: Cardiology

## 2024-10-21 ENCOUNTER — Ambulatory Visit: Admitting: Cardiology

## 2024-10-21 ENCOUNTER — Telehealth: Payer: Self-pay

## 2024-10-21 VITALS — BP 132/86 | HR 79 | Ht 67.0 in | Wt 232.8 lb

## 2024-10-21 DIAGNOSIS — J22 Unspecified acute lower respiratory infection: Secondary | ICD-10-CM | POA: Diagnosis not present

## 2024-10-21 DIAGNOSIS — Z0130 Encounter for examination of blood pressure without abnormal findings: Secondary | ICD-10-CM

## 2024-10-21 MED ORDER — LEVOFLOXACIN 500 MG PO TABS: 500.0000 mg | ORAL_TABLET | Freq: Every day | ORAL | 0 refills | Status: AC

## 2024-10-21 MED ORDER — BENZONATATE 200 MG PO CAPS: 200.0000 mg | ORAL_CAPSULE | Freq: Three times a day (TID) | ORAL | 0 refills | Status: DC | PRN

## 2024-10-21 MED ORDER — METHYLPREDNISOLONE 4 MG PO TBPK: ORAL_TABLET | ORAL | 0 refills | Status: DC

## 2024-10-21 NOTE — Progress Notes (Unsigned)
 "  Established Patient Office Visit  Subjective:  Patient ID: Carol Davis, female    DOB: 10-14-1956  Age: 68 y.o. MRN: [id number]  Chief Complaint  Patient presents with   Acute Visit    Sinus congestion primarily at night, headache, dry cough, unable to sleep, sneezing, clear mucus.     Patient in office for an acute visit. Patient complaining of congestion. Clear sputum. Patient was seen in office on 10/04/24 with complaints of persistent cough started last Sunday, drainage down throat, coughing up green flem. Treated for lower respiratory infection with Augmentin  and medrol  dose pack. Patient comes in today, continues to have chest congestion and cough, clear sputum now. No fever. Complains of wheezing at night when laying down, no wheezing noted on exam today. Will send in tessalon  pearls. Due to upcoming holiday, will send in a pill in a pocket, levofloxacin  and medrol  dose pack to take if condition worsens. Continue Mucinex  D. Drink plenty of water.   URI  This is a new problem. The current episode started in the past 7 days. The problem has been waxing and waning. There has been no fever. Associated symptoms include congestion, coughing, headaches, rhinorrhea, sneezing and wheezing. Pertinent negatives include no abdominal pain, chest pain, diarrhea, joint pain, sinus pain or sore throat. She has tried nothing for the symptoms. The treatment provided no relief.    No other concerns at this time.   Past Medical History:  Diagnosis Date   Anemia    IDA   HSV-2 (herpes simplex virus 2) infection    Leucopenia    STD (sexually transmitted disease)    pos gonorrhea   Thyroid  disease     Past Surgical History:  Procedure Laterality Date   ANUS SURGERY  6-10 years ago   AUGMENTATION MAMMAPLASTY  10/2016   removed    BREAST IMPLANT REMOVAL Bilateral    COLONOSCOPY  within last 3 years   TOTAL HIP ARTHROPLASTY Left     Social History   Socioeconomic History   Marital  status: Single    Spouse name: Not on file   Number of children: Not on file   Years of education: Not on file   Highest education level: Not on file  Occupational History   Not on file  Tobacco Use   Smoking status: Former    Current packs/day: 0.00    Types: Cigarettes    Quit date: 02/29/1988    Years since quitting: 36.6   Smokeless tobacco: Never   Tobacco comments:    quit 30 years ago  Vaping Use   Vaping status: Never Used  Substance and Sexual Activity   Alcohol use: Not Currently   Drug use: Not Currently   Sexual activity: Not Currently    Partners: Male    Birth control/protection: Post-menopausal  Other Topics Concern   Not on file  Social History Narrative   Not on file   Social Drivers of Health   Tobacco Use: Medium Risk (10/21/2024)   Patient History    Smoking Tobacco Use: Former    Smokeless Tobacco Use: Never    Passive Exposure: Not on Actuary Strain: Not on file  Food Insecurity: Low Risk (07/25/2024)   Received from [facility]   Epic    Within the past 12 months, you worried that your food would run out before you got money to buy more: Never true    Within the past 12 months, the food you  bought just didn't last and you didn't have money to get more. : Never true  Transportation Needs: No Transportation Needs (07/25/2024)   Received from [facility]    In the past 12 months, has lack of reliable transportation kept you from medical appointments, meetings, work or from getting things needed for daily living? : No  Physical Activity: Not on file  Stress: Not on file  Social Connections: Not on file  Intimate Partner Violence: Not on file  Depression (PHQ2-9): Low Risk (08/19/2024)   Depression (PHQ2-9)    PHQ-2 Score: 0  Alcohol Screen: Not on file  Housing: Low Risk (07/25/2024)   Received from [facility]   Epic    What is your living situation today?: I have a steady place to live    Think about the  place you live. Do you have problems with any of the following? Choose all that apply:: None/None on this list  Utilities: Low Risk (07/25/2024)   Received from [facility]   Utilities    In the past 12 months has the electric, gas, oil, or water company threatened to shut off services in your home? : No  Health Literacy: Not on file    Family History  Problem Relation Age of Onset   Diabetes Mother    Diabetes Father    Diabetes Brother    Breast cancer Neg Hx    Ovarian cancer Neg Hx    Colon cancer Neg Hx    Heart disease Neg Hx     Allergies[1]  Show/hide medication list[2]  Review of Systems  Constitutional: Negative.   HENT:  Positive for congestion, rhinorrhea and sneezing. Negative for sinus pain and sore throat.   Eyes: Negative.   Respiratory:  Positive for cough and wheezing. Negative for shortness of breath.   Cardiovascular: Negative.  Negative for chest pain.  Gastrointestinal: Negative.  Negative for abdominal pain, constipation and diarrhea.  Genitourinary: Negative.   Musculoskeletal:  Negative for joint pain and myalgias.  Skin: Negative.   Neurological:  Positive for headaches. Negative for dizziness.  Endo/Heme/Allergies: Negative.   All other systems reviewed and are negative.      Objective:   BP 132/86   Pulse 79   Ht 5' 7 (1.702 m)   Wt 232 lb 12.8 oz (105.6 kg)   SpO2 95%   BMI 36.46 kg/m   Vitals:   10/21/24 1509  BP: 132/86  Pulse: 79  Height: 5' 7 (1.702 m)  Weight: 232 lb 12.8 oz (105.6 kg)  SpO2: 95%  BMI (Calculated): 36.45    Physical Exam Vitals and nursing note reviewed.  Constitutional:      Appearance: Normal appearance. She is normal weight.  HENT:     Head: Normocephalic and atraumatic.     Nose: Nose normal.     Mouth/Throat:     Mouth: Mucous membranes are moist.  Eyes:     Extraocular Movements: Extraocular movements intact.     Conjunctiva/sclera: Conjunctivae normal.     Pupils: Pupils are equal,  round, and reactive to light.  Cardiovascular:     Rate and Rhythm: Normal rate and regular rhythm.     Pulses: Normal pulses.     Heart sounds: Normal heart sounds.  Pulmonary:     Effort: Pulmonary effort is normal.     Breath sounds: Normal breath sounds. No stridor. No wheezing, rhonchi or rales.  Abdominal:     General: Abdomen is flat. Bowel  sounds are normal.     Palpations: Abdomen is soft.  Musculoskeletal:        General: Normal range of motion.     Cervical back: Normal range of motion.  Skin:    General: Skin is warm and dry.  Neurological:     General: No focal deficit present.     Mental Status: She is alert and oriented to person, place, and time.  Psychiatric:        Mood and Affect: Mood normal.        Behavior: Behavior normal.        Thought Content: Thought content normal.        Judgment: Judgment normal.      No results found for any visits on 10/21/24.  No results found for this or any previous visit (from the past 2160 hours).    Assessment & Plan:  Levofloxacin  Medrol  dose pack Tessalon  pearls Mucinex  D Drink plenty of water  Problem List Items Addressed This Visit       Respiratory   Acute lower respiratory infection - Primary    Return if symptoms worsen or fail to improve, for as scheduled with amanda.   Total time spent: 25 minutes. This time includes review of previous notes and results and patient face to face interaction during today's visit.    Jeoffrey Pollen, NP  10/21/2024   This document may have been prepared by [facility] Voice Recognition software and as such may include unintentional dictation errors.      [1]  Allergies Allergen Reactions   Other Dermatitis    dermabond adhesive    [2]  Outpatient Medications Prior to Visit  Medication Sig   celecoxib (CELEBREX) 200 MG capsule Take 200 mg by mouth daily.   levothyroxine (SYNTHROID) 112 MCG tablet Take 112 mcg by mouth daily before breakfast.   loratadine  (CLARITIN) 10 MG tablet Take 10 mg by mouth daily as needed.   guaiFENesin -codeine  100-10 MG/5ML syrup Take 5 mLs by mouth at bedtime as needed for cough. (Patient not taking: Reported on 10/21/2024)   valACYclovir  (VALTREX ) 1000 MG tablet Take 1 tablet (1,000 mg total) by mouth daily. (Patient not taking: Reported on 10/21/2024)   [DISCONTINUED] benzonatate  (TESSALON ) 200 MG capsule Take 1 capsule (200 mg total) by mouth 3 (three) times daily as needed for cough. (Patient not taking: Reported on 10/21/2024)   [DISCONTINUED] methylPREDNISolone  (MEDROL  DOSEPAK) 4 MG TBPK tablet As directed (Patient not taking: Reported on 10/21/2024)   No facility-administered medications prior to visit.   "

## 2024-10-21 NOTE — Telephone Encounter (Signed)
 Pt states she was seen 2 weeks ago for a cough but it has gotten worse. Pt is requesting antibiotics.

## 2024-10-22 ENCOUNTER — Other Ambulatory Visit: Payer: Self-pay

## 2024-10-22 NOTE — Telephone Encounter (Signed)
Pt informed

## 2024-10-28 ENCOUNTER — Ambulatory Visit
Admission: RE | Admit: 2024-10-28 | Discharge: 2024-10-28 | Disposition: A | Discharge status: Home / Self Care | Attending: Cardiovascular Disease | Admitting: Cardiovascular Disease

## 2024-10-28 ENCOUNTER — Ambulatory Visit
Admission: RE | Admit: 2024-10-28 | Discharge: 2024-10-28 | Disposition: A | Source: Ambulatory Visit | Attending: Cardiovascular Disease | Admitting: Cardiovascular Disease

## 2024-10-28 ENCOUNTER — Encounter: Payer: Self-pay | Admitting: Cardiovascular Disease

## 2024-10-28 ENCOUNTER — Other Ambulatory Visit (INDEPENDENT_AMBULATORY_CARE_PROVIDER_SITE_OTHER)

## 2024-10-28 ENCOUNTER — Ambulatory Visit: Admitting: Cardiovascular Disease

## 2024-10-28 VITALS — BP 127/84 | HR 76 | Ht 67.0 in | Wt 231.0 lb

## 2024-10-28 DIAGNOSIS — I252 Old myocardial infarction: Secondary | ICD-10-CM

## 2024-10-28 DIAGNOSIS — J22 Unspecified acute lower respiratory infection: Secondary | ICD-10-CM | POA: Insufficient documentation

## 2024-10-28 DIAGNOSIS — R9431 Abnormal electrocardiogram [ECG] [EKG]: Secondary | ICD-10-CM | POA: Diagnosis not present

## 2024-10-28 DIAGNOSIS — I509 Heart failure, unspecified: Secondary | ICD-10-CM

## 2024-10-28 DIAGNOSIS — R0602 Shortness of breath: Secondary | ICD-10-CM | POA: Diagnosis not present

## 2024-10-28 DIAGNOSIS — R051 Acute cough: Secondary | ICD-10-CM

## 2024-10-28 LAB — POCT RAPID INFLUENZA A&B
FLU A: NEGATIVE
FLU B: NEGATIVE

## 2024-10-28 MED ORDER — FUROSEMIDE 20 MG PO TABS: 20.0000 mg | ORAL_TABLET | Freq: Every day | ORAL | 11 refills | Status: AC

## 2024-10-28 NOTE — Progress Notes (Signed)
 "     Cardiology Office Note   Date:  10/28/2024   ID:  Carol Davis, DOB 09-25-1956, MRN [id number]  PCP:  Orlean Alan HERO, FNP  Cardiologist:  Denyse Bathe, MD      History of Present Illness: Carol Davis is a 68 y.o. female who presents for  Chief Complaint  Patient presents with   Acute Visit    Coughing and wheezing since Dec. 1    68 YOWF had cough and SOB, and did not get better after treatment with steroids. No chest pain but SOB.      Past Medical History:  Diagnosis Date   Anemia    IDA   HSV-2 (herpes simplex virus 2) infection    Leucopenia    STD (sexually transmitted disease)    pos gonorrhea   Thyroid  disease      Past Surgical History:  Procedure Laterality Date   ANUS SURGERY  6-10 years ago   AUGMENTATION MAMMAPLASTY  10/2016   removed    BREAST IMPLANT REMOVAL Bilateral    COLONOSCOPY  within last 3 years   TOTAL HIP ARTHROPLASTY Left      Current Outpatient Medications  Medication Sig Dispense Refill   benzonatate  (TESSALON ) 200 MG capsule Take 1 capsule (200 mg total) by mouth 3 (three) times daily as needed for cough. 20 capsule 0   celecoxib (CELEBREX) 200 MG capsule Take 200 mg by mouth daily.     furosemide (LASIX) 20 MG tablet Take 1 tablet (20 mg total) by mouth daily. 30 tablet 11   levothyroxine (SYNTHROID) 112 MCG tablet Take 112 mcg by mouth daily before breakfast.     loratadine (CLARITIN) 10 MG tablet Take 10 mg by mouth daily as needed.     methylPREDNISolone  (MEDROL  DOSEPAK) 4 MG TBPK tablet As directed 1 each 0   guaiFENesin -codeine  100-10 MG/5ML syrup Take 5 mLs by mouth at bedtime as needed for cough. (Patient not taking: Reported on 10/28/2024) 80 mL 0   valACYclovir  (VALTREX ) 1000 MG tablet Take 1 tablet (1,000 mg total) by mouth daily. (Patient not taking: Reported on 10/28/2024) 5 tablet 12   No current facility-administered medications for this visit.    Allergies:   Other    Social History:   reports  that she quit smoking about 36 years ago. Her smoking use included cigarettes. She has never used smokeless tobacco. She reports that she does not currently use alcohol. She reports that she does not currently use drugs.   Family History:  family history includes Diabetes in her brother, father, and mother.    ROS:     Review of Systems  Constitutional: Negative.   HENT: Negative.    Eyes: Negative.   Respiratory: Negative.    Gastrointestinal: Negative.   Genitourinary: Negative.   Musculoskeletal: Negative.   Skin: Negative.   Neurological: Negative.   Endo/Heme/Allergies: Negative.   Psychiatric/Behavioral: Negative.    All other systems reviewed and are negative.     All other systems are reviewed and negative.    PHYSICAL EXAM: VS:  BP 127/84   Pulse 76   Ht 5' 7 (1.702 m)   Wt 231 lb (104.8 kg)   SpO2 94%   BMI 36.18 kg/m  , BMI Body mass index is 36.18 kg/m. Last weight:  Wt Readings from Last 3 Encounters:  10/28/24 231 lb (104.8 kg)  10/21/24 232 lb 12.8 oz (105.6 kg)  10/04/24 230 lb 9.6 oz (104.6 kg)  Physical Exam Constitutional:      Appearance: Normal appearance.  Cardiovascular:     Rate and Rhythm: Normal rate and regular rhythm.     Heart sounds: Normal heart sounds.  Pulmonary:     Effort: Pulmonary effort is normal.     Breath sounds: Normal breath sounds.  Musculoskeletal:     Right lower leg: No edema.     Left lower leg: No edema.  Neurological:     Mental Status: She is alert.       EKG: NSR 72/MIN OLD ASMI  Recent Labs: No results found for requested labs within last 365 days.    Lipid Panel    Component Value Date/Time   CHOL 217 (H) 09/29/2022 0849   TRIG 53 09/29/2022 0849   HDL 82 09/29/2022 0849   CHOLHDL 2.6 09/29/2022 0849   LDLCALC 126 (H) 09/29/2022 0849      Other studies Reviewed: Additional studies/ records that were reviewed today include:  Review of the above records demonstrates:       No data  to display            ASSESSMENT AND PLAN:    ICD-10-CM   1. 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 Acute lower respiratory infection  J22 furosemide (LASIX) 20 MG tablet    DG Chest 2 View    PCV ECHOCARDIOGRAM COMPLETE    3. Acute cough  R05.1 furosemide (LASIX) 20 MG tablet    DG Chest 2 View    PCV ECHOCARDIOGRAM COMPLETE    4. Acute congestive heart failure, unspecified heart failure type (HCC)  I50.9 furosemide (LASIX) 20 MG tablet    DG Chest 2 View    PCV ECHOCARDIOGRAM COMPLETE    5. SOB (shortness of breath)  R06.02 furosemide (LASIX) 20 MG tablet    DG Chest 2 View    PCV ECHOCARDIOGRAM COMPLETE    6. Abnormal EKG  R94.31        Problem List Items Addressed This Visit       Respiratory   Acute lower respiratory infection   Relevant Medications   furosemide (LASIX) 20 MG tablet   Other Relevant Orders   DG Chest 2 View   PCV ECHOCARDIOGRAM COMPLETE   Other Visit Diagnoses       Old MI (myocardial infarction)    -  Primary   EKG old ASMI, ADVISE STRESS TEST LATER ON   Relevant Medications   furosemide (LASIX) 20 MG tablet     Acute cough       Relevant Medications   furosemide (LASIX) 20 MG tablet   Other Relevant Orders   DG Chest 2 View   PCV ECHOCARDIOGRAM COMPLETE     Acute congestive heart failure, unspecified heart failure type (HCC)       Relevant Medications   furosemide (LASIX) 20 MG tablet   Other Relevant Orders   DG Chest 2 View   PCV ECHOCARDIOGRAM COMPLETE     SOB (shortness of breath)       Relevant Medications   furosemide (LASIX) 20 MG tablet   Other Relevant Orders   DG Chest 2 View   PCV ECHOCARDIOGRAM COMPLETE     Abnormal EKG              Disposition:   No follow-ups on file.    Total time spent: 50 minutes  Signed,  [employer]  Fernand, MD  10/28/2024 10:03 AM    [facility] "

## 2024-10-28 NOTE — Progress Notes (Addendum)
 "     Cardiology Office Note   Date:  10/28/2024   ID:  Carol Davis, DOB 09-29-1956, MRN [id number]  PCP:  Orlean Alan HERO, FNP  Cardiologist:  Denyse Bathe, MD      History of Present Illness: Carol Davis is a 68 y.o. female who presents for  Chief Complaint  Patient presents with   Acute Visit    Coughing and wheezing since Dec. 1    HPI    Past Medical History:  Diagnosis Date   Anemia    IDA   HSV-2 (herpes simplex virus 2) infection    Leucopenia    STD (sexually transmitted disease)    pos gonorrhea   Thyroid  disease      Past Surgical History:  Procedure Laterality Date   ANUS SURGERY  6-10 years ago   AUGMENTATION MAMMAPLASTY  10/2016   removed    BREAST IMPLANT REMOVAL Bilateral    COLONOSCOPY  within last 3 years   TOTAL HIP ARTHROPLASTY Left      Current Outpatient Medications  Medication Sig Dispense Refill   benzonatate  (TESSALON ) 200 MG capsule Take 1 capsule (200 mg total) by mouth 3 (three) times daily as needed for cough. 20 capsule 0   celecoxib (CELEBREX) 200 MG capsule Take 200 mg by mouth daily.     furosemide (LASIX) 20 MG tablet Take 1 tablet (20 mg total) by mouth daily. 30 tablet 11   levothyroxine (SYNTHROID) 112 MCG tablet Take 112 mcg by mouth daily before breakfast.     loratadine (CLARITIN) 10 MG tablet Take 10 mg by mouth daily as needed.     methylPREDNISolone  (MEDROL  DOSEPAK) 4 MG TBPK tablet As directed 1 each 0   guaiFENesin -codeine  100-10 MG/5ML syrup Take 5 mLs by mouth at bedtime as needed for cough. (Patient not taking: Reported on 10/28/2024) 80 mL 0   valACYclovir  (VALTREX ) 1000 MG tablet Take 1 tablet (1,000 mg total) by mouth daily. (Patient not taking: Reported on 10/28/2024) 5 tablet 12   No current facility-administered medications for this visit.    Allergies:   Other    Social History:   reports that she quit smoking about 36 years ago. Her smoking use included cigarettes. She has never used  smokeless tobacco. She reports that she does not currently use alcohol. She reports that she does not currently use drugs.   Family History:  family history includes Diabetes in her brother, father, and mother.    ROS:     ROS    All other systems are reviewed and negative.    PHYSICAL EXAM: VS:  BP 127/84   Pulse 76   Ht 5' 7 (1.702 m)   Wt 231 lb (104.8 kg)   SpO2 94%   BMI 36.18 kg/m  , BMI Body mass index is 36.18 kg/m. Last weight:  Wt Readings from Last 3 Encounters:  10/28/24 231 lb (104.8 kg)  10/21/24 232 lb 12.8 oz (105.6 kg)  10/04/24 230 lb 9.6 oz (104.6 kg)     Physical Exam    EKG:   Recent Labs: No results found for requested labs within last 365 days.    Lipid Panel    Component Value Date/Time   CHOL 217 (H) 09/29/2022 0849   TRIG 53 09/29/2022 0849   HDL 82 09/29/2022 0849   CHOLHDL 2.6 09/29/2022 0849   LDLCALC 126 (H) 09/29/2022 0849      Other studies Reviewed: Additional studies/ records that were  reviewed today include:  Review of the above records demonstrates:       No data to display            ASSESSMENT AND PLAN:    ICD-10-CM   1. 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 Acute lower respiratory infection  J22 furosemide (LASIX) 20 MG tablet    DG Chest 2 View    PCV ECHOCARDIOGRAM COMPLETE    3. Acute cough  R05.1 furosemide (LASIX) 20 MG tablet    DG Chest 2 View    PCV ECHOCARDIOGRAM COMPLETE    4. Acute congestive heart failure, unspecified heart failure type (HCC)  I50.9 furosemide (LASIX) 20 MG tablet    DG Chest 2 View    PCV ECHOCARDIOGRAM COMPLETE    5. SOB (shortness of breath)  R06.02 furosemide (LASIX) 20 MG tablet    DG Chest 2 View    PCV ECHOCARDIOGRAM COMPLETE   Get CXR and echo, flu test. Also add lasix 20 daily. If CXR shows pneumonia, see Neelam. worried about cough, as not getting better.    6. Abnormal EKG  R94.31        Problem List  Items Addressed This Visit       Respiratory   Acute lower respiratory infection   Relevant Medications   furosemide (LASIX) 20 MG tablet   Other Relevant Orders   DG Chest 2 View   PCV ECHOCARDIOGRAM COMPLETE   Other Visit Diagnoses       Old MI (myocardial infarction)    -  Primary   EKG old ASMI, ADVISE STRESS TEST LATER ON   Relevant Medications   furosemide (LASIX) 20 MG tablet     Acute cough       Relevant Medications   furosemide (LASIX) 20 MG tablet   Other Relevant Orders   DG Chest 2 View   PCV ECHOCARDIOGRAM COMPLETE     Acute congestive heart failure, unspecified heart failure type (HCC)       Relevant Medications   furosemide (LASIX) 20 MG tablet   Other Relevant Orders   DG Chest 2 View   PCV ECHOCARDIOGRAM COMPLETE     SOB (shortness of breath)       Get CXR and echo, flu test. Also add lasix 20 daily. If CXR shows pneumonia, see Neelam. worried about cough, as not getting better.   Relevant Medications   furosemide (LASIX) 20 MG tablet   Other Relevant Orders   DG Chest 2 View   PCV ECHOCARDIOGRAM COMPLETE     Abnormal EKG              Disposition:   Return in about 2 weeks (around 11/11/2024) for ECHO, CXR AND F/U, ALSO SEE NEELAM .    Total time spent: 45 minutes  Signed,  Denyse Bathe, MD  10/28/2024 10:23 AM    [facility]  "

## 2024-10-29 ENCOUNTER — Ambulatory Visit: Payer: Self-pay

## 2024-11-01 ENCOUNTER — Encounter: Payer: Self-pay | Admitting: Internal Medicine

## 2024-11-01 ENCOUNTER — Ambulatory Visit: Admitting: Internal Medicine

## 2024-11-01 VITALS — BP 110/74 | HR 101 | Ht 67.0 in | Wt 232.0 lb

## 2024-11-01 DIAGNOSIS — F1021 Alcohol dependence, in remission: Secondary | ICD-10-CM | POA: Diagnosis not present

## 2024-11-01 DIAGNOSIS — R051 Acute cough: Secondary | ICD-10-CM

## 2024-11-01 DIAGNOSIS — J22 Unspecified acute lower respiratory infection: Secondary | ICD-10-CM

## 2024-11-01 DIAGNOSIS — Z0130 Encounter for examination of blood pressure without abnormal findings: Secondary | ICD-10-CM

## 2024-11-01 DIAGNOSIS — E66812 Obesity, class 2: Secondary | ICD-10-CM | POA: Diagnosis not present

## 2024-11-01 DIAGNOSIS — Z6836 Body mass index (BMI) 36.0-36.9, adult: Secondary | ICD-10-CM

## 2024-11-01 MED ORDER — FLUTICASONE-SALMETEROL 115-21 MCG/ACT IN AERO: 2.0000 | INHALATION_SPRAY | Freq: Two times a day (BID) | RESPIRATORY_TRACT | 12 refills | Status: AC

## 2024-11-01 MED ORDER — BENZONATATE 200 MG PO CAPS: 200.0000 mg | ORAL_CAPSULE | Freq: Three times a day (TID) | ORAL | 0 refills | Status: AC | PRN

## 2024-11-01 NOTE — Progress Notes (Signed)
 "  Established Patient Office Visit  Subjective:  Patient ID: Carol Davis, female    DOB: [birth year]  Age: 69 y.o. MRN: [id number]  Chief Complaint  Patient presents with   Cough    Patient is here today for follow up for cough that began beginning of December.  She endorses coughing spells constantly throughout the day up to 20-30 times. She reports wheezing at night. She was having difficulty sleeping in the beginning but denies having problems sleeping at night at this time. She reports tessalon  pearls have helped some and would like a refill. Denies fever and chills.  She has completed 2 courses medrol  dose pack and a course of Augmentin  and Levaquin . She finished Levaquin  10/24/24. She had chest xray completed 12/29 that has not been read by radiologist at this time. Will defer additional antibiotics at this time. Will start Advair inhaler twice daily for cough, shortness of breath or wheezing. Patient reports having albuterol  inhaler at home that she has been using multiple times a day. Advised patient to continue using that as needed in addition to the new inhaler. She reports no known hx of COPD or asthma. Patient does have smoking hx but quit smoking 1989.  Patient has upcoming echo and stress test scheduled. She has hx of MI. She was started on lasix but reports she has not started taking it yet. She wants to trial new inhaler before doing further Cardiac workup. Advised patient that future Cardiac workup would be beneficial since she has hx of MI. Patient willing to get Cardiac workup completed after her cough resolves.  Cough Associated symptoms include shortness of breath and wheezing. Pertinent negatives include no chest pain, chills, fever, headaches, heartburn, myalgias or sore throat.    No other concerns at this time.   Past Medical History:  Diagnosis Date   Anemia    IDA   HSV-2 (herpes simplex virus 2) infection    Leucopenia    STD (sexually transmitted disease)     pos gonorrhea   Thyroid  disease     Past Surgical History:  Procedure Laterality Date   ANUS SURGERY  6-10 years ago   AUGMENTATION MAMMAPLASTY  10/2016   removed    BREAST IMPLANT REMOVAL Bilateral    COLONOSCOPY  within last 3 years   TOTAL HIP ARTHROPLASTY Left     Social History   Socioeconomic History   Marital status: Single    Spouse name: Not on file   Number of children: Not on file   Years of education: Not on file   Highest education level: Not on file  Occupational History   Not on file  Tobacco Use   Smoking status: Former    Current packs/day: 0.00    Types: Cigarettes    Quit date: 02/29/1988    Years since quitting: 36.6   Smokeless tobacco: Never   Tobacco comments:    quit 30 years ago  Vaping Use   Vaping status: Never Used  Substance and Sexual Activity   Alcohol use: Not Currently   Drug use: Not Currently   Sexual activity: Not Currently    Partners: Male    Birth control/protection: Post-menopausal  Other Topics Concern   Not on file  Social History Narrative   Not on file   Social Drivers of Health   Tobacco Use: Medium Risk (11/01/2024)   Patient History    Smoking Tobacco Use: Former    Smokeless Tobacco Use: Never    Passive  Exposure: Not on file  Financial Resource Strain: Not on file  Food Insecurity: Low Risk (07/25/2024)   Received from [facility]   Epic    Within the past 12 months, you worried that your food would run out before you got money to buy more: Never true    Within the past 12 months, the food you bought just didn't last and you didn't have money to get more. : Never true  Transportation Needs: No Transportation Needs (07/25/2024)   Received from [facility]    In the past 12 months, has lack of reliable transportation kept you from medical appointments, meetings, work or from getting things needed for daily living? : No  Physical Activity: Not on file  Stress: Not on file  Social  Connections: Not on file  Intimate Partner Violence: Not on file  Depression (PHQ2-9): Low Risk (08/19/2024)   Depression (PHQ2-9)    PHQ-2 Score: 0  Alcohol Screen: Not on file  Housing: Low Risk (07/25/2024)   Received from [facility]   Epic    What is your living situation today?: I have a steady place to live    Think about the place you live. Do you have problems with any of the following? Choose all that apply:: None/None on this list  Utilities: Low Risk (07/25/2024)   Received from [facility]   Utilities    In the past 12 months has the electric, gas, oil, or water company threatened to shut off services in your home? : No  Health Literacy: Not on file    Family History  Problem Relation Age of Onset   Diabetes Mother    Diabetes Father    Diabetes Brother    Breast cancer Neg Hx    Ovarian cancer Neg Hx    Colon cancer Neg Hx    Heart disease Neg Hx     Allergies[1]  Show/hide medication list[2]  Review of Systems  Constitutional: Negative.  Negative for chills, fever and malaise/fatigue.  HENT: Negative.  Negative for congestion and sore throat.   Eyes: Negative.  Negative for blurred vision and pain.  Respiratory:  Positive for cough, shortness of breath and wheezing.   Cardiovascular: Negative.  Negative for chest pain, palpitations and leg swelling.  Gastrointestinal: Negative.  Negative for abdominal pain, blood in stool, constipation, diarrhea, heartburn, melena, nausea and vomiting.  Genitourinary: Negative.  Negative for dysuria, flank pain, frequency and urgency.  Musculoskeletal: Negative.  Negative for joint pain and myalgias.  Skin: Negative.   Neurological: Negative.  Negative for dizziness, tingling, sensory change, weakness and headaches.  Endo/Heme/Allergies: Negative.   Psychiatric/Behavioral: Negative.  Negative for depression and suicidal ideas. The patient is not nervous/anxious.        Objective:   BP 110/74   Pulse (!) 101   Ht  5' 7 (1.702 m)   Wt 232 lb (105.2 kg)   SpO2 96%   BMI 36.34 kg/m   Vitals:   11/01/24 1135  BP: 110/74  Pulse: (!) 101  Height: 5' 7 (1.702 m)  Weight: 232 lb (105.2 kg)  SpO2: 96%  BMI (Calculated): 36.33    Physical Exam Vitals and nursing note reviewed.  Constitutional:      Appearance: Normal appearance.  HENT:     Head: Normocephalic and atraumatic.     Nose: Nose normal.     Mouth/Throat:     Mouth: Mucous membranes are moist.     Pharynx: Oropharynx  is clear.  Eyes:     Conjunctiva/sclera: Conjunctivae normal.     Pupils: Pupils are equal, round, and reactive to light.  Cardiovascular:     Rate and Rhythm: Normal rate and regular rhythm.     Pulses: Normal pulses.     Heart sounds: Normal heart sounds. No murmur heard. Pulmonary:     Effort: Pulmonary effort is normal.     Breath sounds: Wheezing present.  Abdominal:     General: Bowel sounds are normal.     Palpations: Abdomen is soft.     Tenderness: There is no abdominal tenderness. There is no right CVA tenderness or left CVA tenderness.  Musculoskeletal:        General: Normal range of motion.     Cervical back: Normal range of motion.     Right lower leg: No edema.     Left lower leg: No edema.  Skin:    General: Skin is warm and dry.  Neurological:     General: No focal deficit present.     Mental Status: She is alert and oriented to person, place, and time.  Psychiatric:        Mood and Affect: Mood normal.        Behavior: Behavior normal.      No results found for any visits on 11/01/24.  Recent Results (from the past 2160 hours)  POCT Rapid Influenza A&B     Status: Normal   Collection Time: 10/28/24 10:19 AM  Result Value Ref Range   FLU A Negative    FLU B Negative       Assessment & Plan:  Awaiting xray results. Start Advair inhaler and albuterol  inhaler as needed. Refill tessalon  pearls. Patient due for medicare AWV in few weeks. Problem List Items Addressed This Visit        Respiratory   Acute lower respiratory infection     Other   Recovering alcoholic in remission (HCC)   Acute cough - Primary   Relevant Medications   fluticasone-salmeterol (ADVAIR HFA) 115-21 MCG/ACT inhaler   benzonatate  (TESSALON ) 200 MG capsule   Class 2 severe obesity due to excess calories with serious comorbidity and body mass index (BMI) of 36.0 to 36.9 in adult    Return in about 2 weeks (around 11/15/2024).   Total time spent: 25 minutes. This time includes review of previous notes and results and patient face to face interaction during today's visit.    FERNAND FREDY RAMAN, MD  11/01/2024   This document may have been prepared by [facility] Voice Recognition software and as such may include unintentional dictation errors.     [1]  Allergies Allergen Reactions   Other Dermatitis    dermabond adhesive    [2]  Outpatient Medications Prior to Visit  Medication Sig   celecoxib (CELEBREX) 200 MG capsule Take 200 mg by mouth daily.   levothyroxine (SYNTHROID) 112 MCG tablet Take 112 mcg by mouth daily before breakfast.   furosemide (LASIX) 20 MG tablet Take 1 tablet (20 mg total) by mouth daily. (Patient not taking: Reported on 11/01/2024)   loratadine (CLARITIN) 10 MG tablet Take 10 mg by mouth daily as needed. (Patient not taking: Reported on 11/01/2024)   valACYclovir  (VALTREX ) 1000 MG tablet Take 1 tablet (1,000 mg total) by mouth daily. (Patient not taking: Reported on 11/01/2024)   [DISCONTINUED] benzonatate  (TESSALON ) 200 MG capsule Take 1 capsule (200 mg total) by mouth 3 (three) times daily as needed for cough. (Patient not  taking: Reported on 11/01/2024)   [DISCONTINUED] guaiFENesin -codeine  100-10 MG/5ML syrup Take 5 mLs by mouth at bedtime as needed for cough. (Patient not taking: Reported on 11/01/2024)   [DISCONTINUED] methylPREDNISolone  (MEDROL  DOSEPAK) 4 MG TBPK tablet As directed (Patient not taking: Reported on 11/01/2024)   No facility-administered medications prior  to visit.   "

## 2024-11-11 ENCOUNTER — Ambulatory Visit: Payer: Self-pay

## 2024-11-15 ENCOUNTER — Ambulatory Visit

## 2024-11-18 ENCOUNTER — Ambulatory Visit: Admitting: Internal Medicine

## 2024-11-25 ENCOUNTER — Ambulatory Visit: Admitting: Family

## 2024-11-28 ENCOUNTER — Ambulatory Visit: Admitting: Cardiovascular Disease

## 2024-12-03 ENCOUNTER — Ambulatory Visit: Admitting: Family

## 2025-01-07 ENCOUNTER — Ambulatory Visit: Admitting: Family
